# Patient Record
Sex: Female | Born: 1965 | Race: Black or African American | Hispanic: No | Marital: Single | Smoking: Current some day smoker
Health system: Southern US, Community
[De-identification: ages and names within clinical notes are randomized; demographics above are authoritative.]

## PROBLEM LIST (undated history)

## (undated) DIAGNOSIS — F419 Anxiety disorder, unspecified: Secondary | ICD-10-CM

## (undated) DIAGNOSIS — M5430 Sciatica, unspecified side: Secondary | ICD-10-CM

## (undated) DIAGNOSIS — I10 Essential (primary) hypertension: Secondary | ICD-10-CM

## (undated) DIAGNOSIS — K219 Gastro-esophageal reflux disease without esophagitis: Secondary | ICD-10-CM

---

## 1999-10-09 ENCOUNTER — Emergency Department (HOSPITAL_COMMUNITY): Admission: EM | Admit: 1999-10-09 | Discharge: 1999-10-10 | Payer: Self-pay | Admitting: Emergency Medicine

## 1999-10-09 ENCOUNTER — Encounter: Payer: Self-pay | Admitting: Emergency Medicine

## 1999-10-09 ENCOUNTER — Emergency Department (HOSPITAL_COMMUNITY): Admission: EM | Admit: 1999-10-09 | Discharge: 1999-10-09 | Payer: Self-pay | Admitting: Emergency Medicine

## 2000-11-25 ENCOUNTER — Encounter: Admission: RE | Admit: 2000-11-25 | Discharge: 2000-11-25 | Payer: Self-pay | Admitting: Family Medicine

## 2000-11-25 ENCOUNTER — Other Ambulatory Visit: Admission: RE | Admit: 2000-11-25 | Discharge: 2000-11-25 | Payer: Self-pay | Admitting: *Deleted

## 2001-12-31 ENCOUNTER — Emergency Department (HOSPITAL_COMMUNITY): Admission: EM | Admit: 2001-12-31 | Discharge: 2001-12-31 | Payer: Self-pay | Admitting: *Deleted

## 2001-12-31 ENCOUNTER — Encounter: Payer: Self-pay | Admitting: *Deleted

## 2002-01-03 ENCOUNTER — Encounter: Admission: RE | Admit: 2002-01-03 | Discharge: 2002-01-03 | Payer: Self-pay | Admitting: Family Medicine

## 2002-01-14 ENCOUNTER — Encounter: Admission: RE | Admit: 2002-01-14 | Discharge: 2002-01-14 | Payer: Self-pay | Admitting: Family Medicine

## 2002-02-08 ENCOUNTER — Encounter: Admission: RE | Admit: 2002-02-08 | Discharge: 2002-02-08 | Payer: Self-pay | Admitting: Sports Medicine

## 2002-02-08 ENCOUNTER — Other Ambulatory Visit: Admission: RE | Admit: 2002-02-08 | Discharge: 2002-02-08 | Payer: Self-pay | Admitting: Family Medicine

## 2003-07-10 ENCOUNTER — Encounter: Admission: RE | Admit: 2003-07-10 | Discharge: 2003-07-10 | Payer: Self-pay | Admitting: Family Medicine

## 2003-09-20 ENCOUNTER — Encounter: Admission: RE | Admit: 2003-09-20 | Discharge: 2003-09-20 | Payer: Self-pay | Admitting: Family Medicine

## 2003-09-20 ENCOUNTER — Other Ambulatory Visit: Admission: RE | Admit: 2003-09-20 | Discharge: 2003-09-20 | Payer: Self-pay | Admitting: Family Medicine

## 2003-09-29 ENCOUNTER — Encounter: Admission: RE | Admit: 2003-09-29 | Discharge: 2003-09-29 | Payer: Self-pay | Admitting: Family Medicine

## 2004-03-13 ENCOUNTER — Emergency Department (HOSPITAL_COMMUNITY): Admission: EM | Admit: 2004-03-13 | Discharge: 2004-03-13 | Payer: Self-pay | Admitting: Emergency Medicine

## 2004-06-03 ENCOUNTER — Ambulatory Visit: Payer: Self-pay | Admitting: Family Medicine

## 2004-07-12 ENCOUNTER — Emergency Department (HOSPITAL_COMMUNITY): Admission: EM | Admit: 2004-07-12 | Discharge: 2004-07-12 | Payer: Self-pay | Admitting: Emergency Medicine

## 2004-07-15 ENCOUNTER — Emergency Department (HOSPITAL_COMMUNITY): Admission: EM | Admit: 2004-07-15 | Discharge: 2004-07-15 | Payer: Self-pay | Admitting: Family Medicine

## 2004-07-16 ENCOUNTER — Emergency Department (HOSPITAL_COMMUNITY): Admission: EM | Admit: 2004-07-16 | Discharge: 2004-07-16 | Payer: Self-pay | Admitting: Emergency Medicine

## 2004-07-22 ENCOUNTER — Emergency Department (HOSPITAL_COMMUNITY): Admission: EM | Admit: 2004-07-22 | Discharge: 2004-07-22 | Payer: Self-pay | Admitting: Family Medicine

## 2004-08-02 ENCOUNTER — Emergency Department (HOSPITAL_COMMUNITY): Admission: EM | Admit: 2004-08-02 | Discharge: 2004-08-02 | Payer: Self-pay | Admitting: Emergency Medicine

## 2004-08-07 ENCOUNTER — Ambulatory Visit: Payer: Self-pay | Admitting: Family Medicine

## 2004-08-18 ENCOUNTER — Emergency Department (HOSPITAL_COMMUNITY): Admission: EM | Admit: 2004-08-18 | Discharge: 2004-08-18 | Payer: Self-pay | Admitting: Family Medicine

## 2004-08-22 ENCOUNTER — Ambulatory Visit: Payer: Self-pay | Admitting: Family Medicine

## 2004-08-24 ENCOUNTER — Encounter (INDEPENDENT_AMBULATORY_CARE_PROVIDER_SITE_OTHER): Payer: Self-pay | Admitting: *Deleted

## 2004-09-11 ENCOUNTER — Other Ambulatory Visit: Admission: RE | Admit: 2004-09-11 | Discharge: 2004-09-11 | Payer: Self-pay | Admitting: Family Medicine

## 2004-09-11 ENCOUNTER — Ambulatory Visit: Payer: Self-pay | Admitting: Sports Medicine

## 2004-09-27 ENCOUNTER — Ambulatory Visit: Payer: Self-pay | Admitting: Family Medicine

## 2004-10-14 ENCOUNTER — Ambulatory Visit: Payer: Self-pay | Admitting: Family Medicine

## 2004-11-28 ENCOUNTER — Ambulatory Visit: Payer: Self-pay | Admitting: Family Medicine

## 2005-01-01 ENCOUNTER — Ambulatory Visit: Payer: Self-pay | Admitting: Family Medicine

## 2005-01-13 ENCOUNTER — Ambulatory Visit: Payer: Self-pay | Admitting: Family Medicine

## 2005-01-19 ENCOUNTER — Emergency Department (HOSPITAL_COMMUNITY): Admission: EM | Admit: 2005-01-19 | Discharge: 2005-01-19 | Payer: Self-pay | Admitting: Emergency Medicine

## 2005-02-07 ENCOUNTER — Ambulatory Visit: Payer: Self-pay | Admitting: Family Medicine

## 2005-04-11 ENCOUNTER — Ambulatory Visit: Payer: Self-pay | Admitting: Family Medicine

## 2005-04-14 ENCOUNTER — Ambulatory Visit: Payer: Self-pay | Admitting: Family Medicine

## 2005-05-04 ENCOUNTER — Emergency Department (HOSPITAL_COMMUNITY): Admission: AD | Admit: 2005-05-04 | Discharge: 2005-05-04 | Payer: Self-pay | Admitting: Family Medicine

## 2005-05-27 ENCOUNTER — Ambulatory Visit: Payer: Self-pay | Admitting: Sports Medicine

## 2005-06-02 ENCOUNTER — Ambulatory Visit: Payer: Self-pay | Admitting: Sports Medicine

## 2005-07-10 ENCOUNTER — Ambulatory Visit: Payer: Self-pay | Admitting: Family Medicine

## 2005-08-12 ENCOUNTER — Ambulatory Visit: Payer: Self-pay | Admitting: Sports Medicine

## 2005-10-02 ENCOUNTER — Other Ambulatory Visit: Admission: RE | Admit: 2005-10-02 | Discharge: 2005-10-02 | Payer: Self-pay | Admitting: Family Medicine

## 2005-10-02 ENCOUNTER — Ambulatory Visit: Payer: Self-pay | Admitting: Family Medicine

## 2005-10-30 ENCOUNTER — Ambulatory Visit: Payer: Self-pay | Admitting: Family Medicine

## 2005-12-05 ENCOUNTER — Ambulatory Visit: Payer: Self-pay | Admitting: Family Medicine

## 2005-12-05 ENCOUNTER — Encounter: Admission: RE | Admit: 2005-12-05 | Discharge: 2005-12-05 | Payer: Self-pay | Admitting: Sports Medicine

## 2006-04-08 ENCOUNTER — Ambulatory Visit: Payer: Self-pay | Admitting: Sports Medicine

## 2006-06-29 ENCOUNTER — Ambulatory Visit: Payer: Self-pay | Admitting: Family Medicine

## 2006-07-03 ENCOUNTER — Ambulatory Visit: Payer: Self-pay | Admitting: Family Medicine

## 2006-07-08 ENCOUNTER — Encounter: Admission: RE | Admit: 2006-07-08 | Discharge: 2006-07-08 | Payer: Self-pay | Admitting: Sports Medicine

## 2006-07-24 ENCOUNTER — Encounter (INDEPENDENT_AMBULATORY_CARE_PROVIDER_SITE_OTHER): Payer: Self-pay | Admitting: *Deleted

## 2006-07-29 ENCOUNTER — Telehealth: Payer: Self-pay | Admitting: *Deleted

## 2006-07-29 ENCOUNTER — Ambulatory Visit: Payer: Self-pay | Admitting: Family Medicine

## 2006-07-29 LAB — CONVERTED CEMR LAB: Rapid Strep: NEGATIVE

## 2006-07-30 ENCOUNTER — Encounter (INDEPENDENT_AMBULATORY_CARE_PROVIDER_SITE_OTHER): Payer: Self-pay | Admitting: Family Medicine

## 2006-07-31 ENCOUNTER — Encounter (INDEPENDENT_AMBULATORY_CARE_PROVIDER_SITE_OTHER): Payer: Self-pay | Admitting: Family Medicine

## 2006-07-31 ENCOUNTER — Ambulatory Visit: Payer: Self-pay | Admitting: Sports Medicine

## 2006-07-31 DIAGNOSIS — E669 Obesity, unspecified: Secondary | ICD-10-CM | POA: Insufficient documentation

## 2006-07-31 DIAGNOSIS — I10 Essential (primary) hypertension: Secondary | ICD-10-CM | POA: Insufficient documentation

## 2006-07-31 LAB — CONVERTED CEMR LAB
BUN: 8 mg/dL (ref 6–23)
Chloride: 108 meq/L (ref 96–112)
Cholesterol: 181 mg/dL (ref 0–200)
HDL: 34 mg/dL — ABNORMAL LOW (ref >39)
LDL Cholesterol: 129 mg/dL — ABNORMAL HIGH (ref 0–99)
Sodium: 141 meq/L (ref 135–145)
Total CHOL/HDL Ratio: 5.3
Triglycerides: 89 mg/dL (ref <150)

## 2006-08-18 ENCOUNTER — Telehealth (INDEPENDENT_AMBULATORY_CARE_PROVIDER_SITE_OTHER): Payer: Self-pay | Admitting: Family Medicine

## 2006-08-25 ENCOUNTER — Encounter (INDEPENDENT_AMBULATORY_CARE_PROVIDER_SITE_OTHER): Payer: Self-pay | Admitting: Family Medicine

## 2006-09-03 ENCOUNTER — Telehealth (INDEPENDENT_AMBULATORY_CARE_PROVIDER_SITE_OTHER): Payer: Self-pay | Admitting: Family Medicine

## 2006-10-07 ENCOUNTER — Telehealth: Payer: Self-pay | Admitting: *Deleted

## 2006-10-28 ENCOUNTER — Telehealth: Payer: Self-pay | Admitting: *Deleted

## 2006-10-29 ENCOUNTER — Ambulatory Visit: Payer: Self-pay | Admitting: Family Medicine

## 2006-10-30 ENCOUNTER — Telehealth (INDEPENDENT_AMBULATORY_CARE_PROVIDER_SITE_OTHER): Payer: Self-pay | Admitting: Family Medicine

## 2006-11-12 ENCOUNTER — Encounter (INDEPENDENT_AMBULATORY_CARE_PROVIDER_SITE_OTHER): Payer: Self-pay | Admitting: Family Medicine

## 2006-11-12 ENCOUNTER — Ambulatory Visit: Payer: Self-pay | Admitting: Sports Medicine

## 2006-11-12 ENCOUNTER — Other Ambulatory Visit: Admission: RE | Admit: 2006-11-12 | Discharge: 2006-11-12 | Payer: Self-pay | Admitting: Family Medicine

## 2006-11-12 LAB — CONVERTED CEMR LAB
GC Probe Amp, Genital: NEGATIVE
KOH Prep: NEGATIVE

## 2006-11-17 ENCOUNTER — Encounter (INDEPENDENT_AMBULATORY_CARE_PROVIDER_SITE_OTHER): Payer: Self-pay | Admitting: Family Medicine

## 2006-12-15 ENCOUNTER — Telehealth (INDEPENDENT_AMBULATORY_CARE_PROVIDER_SITE_OTHER): Payer: Self-pay | Admitting: Family Medicine

## 2006-12-25 ENCOUNTER — Telehealth: Payer: Self-pay | Admitting: *Deleted

## 2007-05-11 ENCOUNTER — Telehealth (INDEPENDENT_AMBULATORY_CARE_PROVIDER_SITE_OTHER): Payer: Self-pay | Admitting: Family Medicine

## 2007-05-28 ENCOUNTER — Telehealth: Payer: Self-pay | Admitting: *Deleted

## 2007-06-30 ENCOUNTER — Telehealth (INDEPENDENT_AMBULATORY_CARE_PROVIDER_SITE_OTHER): Payer: Self-pay | Admitting: *Deleted

## 2007-07-14 ENCOUNTER — Telehealth: Payer: Self-pay | Admitting: *Deleted

## 2007-10-07 ENCOUNTER — Telehealth: Payer: Self-pay | Admitting: *Deleted

## 2007-10-29 ENCOUNTER — Ambulatory Visit: Payer: Self-pay | Admitting: Family Medicine

## 2007-10-29 ENCOUNTER — Encounter: Payer: Self-pay | Admitting: Family Medicine

## 2007-10-29 ENCOUNTER — Telehealth (INDEPENDENT_AMBULATORY_CARE_PROVIDER_SITE_OTHER): Payer: Self-pay | Admitting: *Deleted

## 2007-10-29 LAB — CONVERTED CEMR LAB
Protein, U semiquant: >=300
pH: 6

## 2007-10-30 ENCOUNTER — Encounter: Payer: Self-pay | Admitting: Family Medicine

## 2007-12-30 ENCOUNTER — Telehealth: Payer: Self-pay | Admitting: *Deleted

## 2008-02-16 ENCOUNTER — Telehealth (INDEPENDENT_AMBULATORY_CARE_PROVIDER_SITE_OTHER): Payer: Self-pay | Admitting: Family Medicine

## 2008-03-02 ENCOUNTER — Telehealth (INDEPENDENT_AMBULATORY_CARE_PROVIDER_SITE_OTHER): Payer: Self-pay | Admitting: *Deleted

## 2008-03-03 ENCOUNTER — Ambulatory Visit: Payer: Self-pay | Admitting: Family Medicine

## 2008-03-03 LAB — CONVERTED CEMR LAB: Whiff Test: POSITIVE

## 2008-04-27 ENCOUNTER — Telehealth: Payer: Self-pay | Admitting: *Deleted

## 2008-07-28 ENCOUNTER — Emergency Department (HOSPITAL_COMMUNITY): Admission: EM | Admit: 2008-07-28 | Discharge: 2008-07-28 | Payer: Self-pay | Admitting: Emergency Medicine

## 2008-08-03 ENCOUNTER — Encounter: Payer: Self-pay | Admitting: *Deleted

## 2008-08-04 ENCOUNTER — Ambulatory Visit: Payer: Self-pay | Admitting: Family Medicine

## 2008-08-04 ENCOUNTER — Encounter (INDEPENDENT_AMBULATORY_CARE_PROVIDER_SITE_OTHER): Payer: Self-pay | Admitting: Family Medicine

## 2008-08-04 LAB — CONVERTED CEMR LAB
Bilirubin Urine: NEGATIVE
Blood in Urine, dipstick: NEGATIVE
Ketones, urine, test strip: NEGATIVE
Protein, U semiquant: NEGATIVE
Urobilinogen, UA: 0.2
WBC Urine, dipstick: NEGATIVE
pH: 5.5

## 2008-11-09 ENCOUNTER — Telehealth: Payer: Self-pay | Admitting: Family Medicine

## 2008-11-11 ENCOUNTER — Emergency Department (HOSPITAL_COMMUNITY): Admission: EM | Admit: 2008-11-11 | Discharge: 2008-11-12 | Payer: Self-pay | Admitting: Emergency Medicine

## 2009-02-03 ENCOUNTER — Encounter (INDEPENDENT_AMBULATORY_CARE_PROVIDER_SITE_OTHER): Payer: Self-pay | Admitting: *Deleted

## 2009-02-03 DIAGNOSIS — F17200 Nicotine dependence, unspecified, uncomplicated: Secondary | ICD-10-CM | POA: Insufficient documentation

## 2010-03-29 ENCOUNTER — Encounter: Payer: Self-pay | Admitting: Family Medicine

## 2010-06-27 NOTE — Miscellaneous (Signed)
  Clinical Lists Changes  Problems: Removed problem of MUSCLE SPASM, LUMBAR REGION (ICD-724.8) Removed problem of FREQUENCY, URINARY (ICD-788.41) Removed problem of VAGINITIS, BACTERIAL (ICD-616.10) Removed problem of GYNECOLOGICAL EXAMINATION, ROUTINE (ICD-V72.31) Removed problem of EXPOSURE TO COMMUNICABLE DISEASE NOS (ICD-V01.9) Removed problem of VACCINE AGAINST TETANUS-DIPHTHERIA [TD][DT] (ICD-V06.5) Removed problem of HYPERCHOLESTEROLEMIA, HX OF (ICD-272.0)

## 2010-09-02 LAB — COMPREHENSIVE METABOLIC PANEL
ALT: 24 U/L (ref 0–35)
AST: 27 U/L (ref 0–37)
Albumin: 3.2 g/dL — ABNORMAL LOW (ref 3.5–5.2)
Alkaline Phosphatase: 52 U/L (ref 39–117)
BUN: 5 mg/dL — ABNORMAL LOW (ref 6–23)
Calcium: 8.3 mg/dL — ABNORMAL LOW (ref 8.4–10.5)
GFR calc Af Amer: >60 mL/min (ref >60)
Glucose, Bld: 95 mg/dL (ref 70–99)
Total Bilirubin: 0.4 mg/dL (ref 0.3–1.2)
Total Protein: 6.1 g/dL (ref 6.0–8.3)

## 2010-09-02 LAB — POCT CARDIAC MARKERS
CKMB, poc: <1 ng/mL — ABNORMAL LOW (ref 1.0–8.0)
CKMB, poc: <1 ng/mL — ABNORMAL LOW (ref 1.0–8.0)
Myoglobin, poc: 57.1 ng/mL (ref 12–200)
Myoglobin, poc: 57.7 ng/mL (ref 12–200)
Troponin i, poc: <0.05 ng/mL (ref 0.00–0.09)
Troponin i, poc: <0.05 ng/mL (ref 0.00–0.09)

## 2010-09-02 LAB — CBC
HCT: 36.7 % (ref 36.0–46.0)
MCHC: 32.7 g/dL (ref 30.0–36.0)
Platelets: 185 10*3/uL (ref 150–400)
RBC: 4.57 MIL/uL (ref 3.87–5.11)
WBC: 10.2 10*3/uL (ref 4.0–10.5)

## 2010-09-02 LAB — URINALYSIS, ROUTINE W REFLEX MICROSCOPIC
Bilirubin Urine: NEGATIVE
Glucose, UA: NEGATIVE mg/dL
Hgb urine dipstick: NEGATIVE
Urobilinogen, UA: 0.2 mg/dL (ref 0.0–1.0)

## 2010-09-02 LAB — DIFFERENTIAL
Eosinophils Relative: 2 % (ref 0–5)
Lymphs Abs: 3.6 10*3/uL (ref 0.7–4.0)
Neutro Abs: 5.6 10*3/uL (ref 1.7–7.7)
Neutrophils Relative %: 55 % (ref 43–77)

## 2010-09-02 LAB — LIPASE, BLOOD: Lipase: 27 U/L (ref 11–59)

## 2010-09-02 LAB — POCT PREGNANCY, URINE: Preg Test, Ur: NEGATIVE

## 2010-09-02 LAB — D-DIMER, QUANTITATIVE: D-Dimer, Quant: 0.42 ug/mL-FEU (ref 0.00–0.48)

## 2012-01-30 ENCOUNTER — Observation Stay (HOSPITAL_COMMUNITY)
Admission: EM | Admit: 2012-01-30 | Discharge: 2012-01-31 | Disposition: A | Discharge status: Home / Self Care | Payer: 59 | Attending: Internal Medicine | Admitting: Internal Medicine

## 2012-01-30 ENCOUNTER — Emergency Department (HOSPITAL_COMMUNITY): Payer: Self-pay

## 2012-01-30 ENCOUNTER — Encounter (HOSPITAL_COMMUNITY): Payer: Self-pay

## 2012-01-30 DIAGNOSIS — F411 Generalized anxiety disorder: Secondary | ICD-10-CM | POA: Insufficient documentation

## 2012-01-30 DIAGNOSIS — R0789 Other chest pain: Principal | ICD-10-CM | POA: Diagnosis present

## 2012-01-30 DIAGNOSIS — F419 Anxiety disorder, unspecified: Secondary | ICD-10-CM | POA: Diagnosis present

## 2012-01-30 DIAGNOSIS — K219 Gastro-esophageal reflux disease without esophagitis: Secondary | ICD-10-CM | POA: Insufficient documentation

## 2012-01-30 DIAGNOSIS — E669 Obesity, unspecified: Secondary | ICD-10-CM

## 2012-01-30 DIAGNOSIS — R079 Chest pain, unspecified: Secondary | ICD-10-CM

## 2012-01-30 DIAGNOSIS — I10 Essential (primary) hypertension: Secondary | ICD-10-CM | POA: Diagnosis present

## 2012-01-30 DIAGNOSIS — F17200 Nicotine dependence, unspecified, uncomplicated: Secondary | ICD-10-CM | POA: Diagnosis present

## 2012-01-30 HISTORY — DX: Gastro-esophageal reflux disease without esophagitis: K21.9

## 2012-01-30 HISTORY — DX: Essential (primary) hypertension: I10

## 2012-01-30 HISTORY — DX: Anxiety disorder, unspecified: F41.9

## 2012-01-30 LAB — BASIC METABOLIC PANEL
BUN: 5 mg/dL — ABNORMAL LOW (ref 6–23)
CO2: 28 mEq/L (ref 19–32)
Calcium: 8.7 mg/dL (ref 8.4–10.5)
Creatinine, Ser: 0.82 mg/dL (ref 0.50–1.10)
Glucose, Bld: 90 mg/dL (ref 70–99)

## 2012-01-30 LAB — URINALYSIS, MICROSCOPIC ONLY
Bilirubin Urine: NEGATIVE
Glucose, UA: NEGATIVE mg/dL
Specific Gravity, Urine: 1.018 (ref 1.005–1.030)
pH: 5.5 (ref 5.0–8.0)

## 2012-01-30 LAB — CBC
HCT: 41.5 % (ref 36.0–46.0)
Hemoglobin: 13.6 g/dL (ref 12.0–15.0)
MCH: 25.4 pg — ABNORMAL LOW (ref 26.0–34.0)
MCV: 77.4 fL — ABNORMAL LOW (ref 78.0–100.0)
Platelets: 181 10*3/uL (ref 150–400)
RBC: 5.36 MIL/uL — ABNORMAL HIGH (ref 3.87–5.11)

## 2012-01-30 LAB — POCT I-STAT TROPONIN I: Troponin i, poc: 0.01 ng/mL (ref 0.00–0.08)

## 2012-01-30 MED ORDER — NITROGLYCERIN 0.4 MG SL SUBL
0.4000 mg | SUBLINGUAL_TABLET | SUBLINGUAL | Status: DC | PRN
  Administered 2012-01-30: 0.4 mg via SUBLINGUAL
  Filled 2012-01-30: qty 25

## 2012-01-30 MED ORDER — MORPHINE SULFATE 4 MG/ML IJ SOLN
4.0000 mg | Freq: Once | INTRAMUSCULAR | Status: DC
  Filled 2012-01-30: qty 1

## 2012-01-30 MED ORDER — POTASSIUM CHLORIDE CRYS ER 20 MEQ PO TBCR
40.0000 meq | EXTENDED_RELEASE_TABLET | Freq: Once | ORAL | Status: AC
  Administered 2012-01-30: 40 meq via ORAL
  Filled 2012-01-30: qty 2

## 2012-01-30 MED ORDER — KETOROLAC TROMETHAMINE 30 MG/ML IJ SOLN
30.0000 mg | Freq: Once | INTRAMUSCULAR | Status: AC
  Administered 2012-01-30: 30 mg via INTRAVENOUS
  Filled 2012-01-30: qty 1

## 2012-01-30 MED ORDER — ASPIRIN 325 MG PO TABS
325.0000 mg | ORAL_TABLET | ORAL | Status: AC
  Administered 2012-01-30: 325 mg via ORAL
  Filled 2012-01-30: qty 1

## 2012-01-30 NOTE — ED Notes (Signed)
PA at bedside.

## 2012-01-30 NOTE — ED Notes (Signed)
Patient transported to X-ray, was in CT earlier.

## 2012-01-30 NOTE — ED Provider Notes (Signed)
History     CSN: [id number]  Arrival date & time 01/30/12  1529   First MD Initiated Contact with Patient 01/30/12 2002      Chief Complaint  Patient presents with  . Chest Pain  . Shortness of Breath  . Foot Pain    (Consider location/radiation/quality/duration/timing/severity/associated sxs/prior treatment) HPI History provided by pt.   Pt was diagnosed w/ anxiety attacks in 2007.  They presented with SOB and sweaty palms.  For the past month, since her step-father had a heart attack, she has had increased frequency of panic attacks and they are now presenting w/ CP and paresthesias left face and LUE.  Describes CP as mid-line squeezing and shooting pains that go all the way across.  Occasionally has nausea with associated jaw tightness that does not necessarily occur with the CP/SOB.  These symptoms are non-exertional and can last anywhere from seconds to an hour, usually improving with distraction.  She does however have more SOB and fatigue with climbing the stairs than she used to have.  Pt has HTN and smokes cigarettes.  No FH of early MI.  Denies fever, cough, abd pain and LE pain/edema.  Does not take medication for anxiety.  She also c/o intermittent squiggly lines diffuse, bilateral visual fields with occasional blurred vision.  Denies dizziness and ataxia or any other neuro deficits.     Past Medical History  Diagnosis Date  . Anxiety   . Hypertension   . GERD (gastroesophageal reflux disease)     Past Surgical History  Procedure Date  . Cesarean section     History reviewed. No pertinent family history.  History  Substance Use Topics  . Smoking status: Current Some Day Smoker -- 0.5 packs/day    Types: Cigarettes  . Smokeless tobacco: Never Used  . Alcohol Use: Yes     occasionally    OB History    Grav Para Term Preterm Abortions TAB SAB Ect Mult Living                  Review of Systems  All other systems reviewed and are negative.    Allergies    Review of patient's allergies indicates no known allergies.  Home Medications  No current outpatient prescriptions on file.  BP 138/74  Pulse 50  Temp 98.4 F (36.9 C) (Oral)  Resp 16  SpO2 100%  LMP 01/30/2012  Physical Exam  Nursing note and vitals reviewed. Constitutional: She is oriented to person, place, and time. She appears well-developed and well-nourished. No distress.  HENT:  Head: Normocephalic and atraumatic.  Eyes:       Normal appearance  Neck: Normal range of motion.  Cardiovascular: Normal rate, regular rhythm and intact distal pulses.   Pulmonary/Chest: Effort normal and breath sounds normal.  Abdominal: Soft. Bowel sounds are normal. She exhibits no distension. There is no tenderness.  Musculoskeletal: Normal range of motion.       No LE edema/ttp  Neurological: She is alert and oriented to person, place, and time. She displays no tremor. No sensory deficit. She displays a negative Romberg sign. Coordination and gait normal.       CN 3-12 intact.  5/5 and equal upper and lower extremity strength.  No past pointing.  No pronator drift.  Rotational nystagmus bilaterally when she goes from right lateral gaze to looking straight forward only.    Skin: Skin is warm and dry. No rash noted.       Vitiligo  Psychiatric: She has a normal mood and affect. Her behavior is normal.    ED Course  Procedures (including critical care time)  Labs Reviewed  CBC - Abnormal; Notable for the following:    RBC 5.36 (*)     MCV 77.4 (*)     MCH 25.4 (*)     All other components within normal limits  BASIC METABOLIC PANEL - Abnormal; Notable for the following:    Potassium 3.1 (*)     BUN 5 (*)     GFR calc non Af Amer 85 (*)     All other components within normal limits  URINALYSIS, WITH MICROSCOPIC - Abnormal; Notable for the following:    APPearance CLOUDY (*)     Hgb urine dipstick SMALL (*)     Bacteria, UA FEW (*)     Squamous Epithelial / LPF FEW (*)     All  other components within normal limits  POCT I-STAT TROPONIN I   Dg Chest 2 View  01/30/2012  *RADIOLOGY REPORT*  Clinical Data: Left-sided chest pain, shortness of breath, and anxiety.  CHEST - 2 VIEW  Comparison: 11/11/2008  Findings: The heart size and pulmonary vascularity are normal. The lungs appear clear and expanded without focal air space disease or consolidation. No blunting of the costophrenic angles.  No pneumothorax.  Mediastinal contours appear intact.  No significant change since previous study.  Degenerative changes in the spine.  IMPRESSION: No evidence of active pulmonary disease.   Original Report Authenticated By: Marlon Pel, M.D.    Ct Head Wo Contrast  01/30/2012  *RADIOLOGY REPORT*  Clinical Data: Dizziness  CT HEAD WITHOUT CONTRAST  Technique:  Contiguous axial images were obtained from the base of the skull through the vertex without contrast.  Comparison: None.  Findings: Normal size of the ventricles.  Normal gray-white differentiation.  No evidence of hemorrhage, hydrocephalus, mass lesion, mass effect, or evidence of acute cortically based infarction.  Soft tissues of the orbits and scalp are symmetric.  The visualized paranasal sinuses, mastoid air cells, and middle ears are clear.  IMPRESSION: Normal normal head CT.   Original Report Authenticated By: Britta Mccreedy, M.D.      1. Chest pain   2. Anxiety   3. Obesity, unspecified   4. Tobacco use disorder       MDM  46yo F w/ h/o anxiety, HTN and tobacco abuse presents w/ atypical panic attacks w/ CP, SOB, jaw tightness, LUE paresthesias and nausea.  Also c/o 2 weeks intermittent squiggly lines in vision.  On exam, afebrile, well-appearing, no focal neuro findings w/ exception of bilateral rotational nystagmus when goes from right lateral gaze to looking straight ahead.  CT head, EKG and labs pending.  Pt has received aspirin.  SL nitro ordered for pain.  Will reassess shortly.  8:33 PM    Pt has received one  dose of SL ntg and reports that pain is mildly improved.  VSS.  EKG non-ischemic.  Labs sig for mild hypokalemia and neg troponin.  CXR and CT head neg.  [facility] consulted for admission for r/o MI.  10:13 PM         Otilio Miu, [place] 01/31/12 [phone]

## 2012-01-30 NOTE — ED Notes (Signed)
C/o pain to mid chest/LT chest on/off x few days and across upper back.  Also has been feeling shob.  States she feels like this w/a panic attack.  States she has been having severe episodes since her stepdad had a massive heartattack in august.  Also c/o seeing "squiggly lines" in her vision and a "tingling feeling" in LT jaw that comes and goes.

## 2012-01-30 NOTE — ED Notes (Signed)
Patient transported to X-ray 

## 2012-01-30 NOTE — ED Provider Notes (Signed)
MSE initiated.  Patient reports having episodes of chest pain over the last several days with increasing shortness of breath.  Patient has a history of anxiety, but this presentation with chest pain is different from prior episodes.  Lungs CTA bilaterally.  S1/S2, RRR, no murmur.  O2 sats of 97% on room air.  Monitor reveals NSR without ectopy.  12 lead reviewed, no current indication of ischemia.  CP orders initiated.  Jimmye Norman, NP 01/30/12 1719

## 2012-01-30 NOTE — ED Notes (Addendum)
MD (Dr. Conley Rolls) at bedside, verbally discontinued IV Morphine and ordered Toradol.

## 2012-01-30 NOTE — ED Notes (Signed)
Patient returned from X-ray 

## 2012-01-31 DIAGNOSIS — R0789 Other chest pain: Secondary | ICD-10-CM

## 2012-01-31 DIAGNOSIS — I10 Essential (primary) hypertension: Secondary | ICD-10-CM

## 2012-01-31 DIAGNOSIS — R072 Precordial pain: Secondary | ICD-10-CM

## 2012-01-31 LAB — BASIC METABOLIC PANEL
BUN: 6 mg/dL (ref 6–23)
CO2: 27 mEq/L (ref 19–32)
Calcium: 8.6 mg/dL (ref 8.4–10.5)
Chloride: 104 mEq/L (ref 96–112)
Creatinine, Ser: 0.85 mg/dL (ref 0.50–1.10)
Glucose, Bld: 95 mg/dL (ref 70–99)

## 2012-01-31 LAB — TROPONIN I: Troponin I: <0.3 ng/mL (ref <0.30)

## 2012-01-31 LAB — CBC
HCT: 38 % (ref 36.0–46.0)
MCH: 25.8 pg — ABNORMAL LOW (ref 26.0–34.0)
MCHC: 33.4 g/dL (ref 30.0–36.0)
MCV: 77.1 fL — ABNORMAL LOW (ref 78.0–100.0)
Platelets: 153 10*3/uL (ref 150–400)
RDW: 14.7 % (ref 11.5–15.5)

## 2012-01-31 MED ORDER — SODIUM CHLORIDE 0.9 % IJ SOLN
3.0000 mL | Freq: Two times a day (BID) | INTRAMUSCULAR | Status: DC
  Administered 2012-01-31: 3 mL via INTRAVENOUS

## 2012-01-31 MED ORDER — KETOROLAC TROMETHAMINE 30 MG/ML IJ SOLN
30.0000 mg | Freq: Four times a day (QID) | INTRAMUSCULAR | Status: DC | PRN
  Administered 2012-01-31: 30 mg via INTRAVENOUS
  Filled 2012-01-31: qty 1

## 2012-01-31 MED ORDER — POTASSIUM CHLORIDE CRYS ER 20 MEQ PO TBCR
40.0000 meq | EXTENDED_RELEASE_TABLET | Freq: Every day | ORAL | Status: DC
  Administered 2012-01-31: 40 meq via ORAL
  Filled 2012-01-31: qty 2

## 2012-01-31 MED ORDER — ASPIRIN EC 325 MG PO TBEC
325.0000 mg | DELAYED_RELEASE_TABLET | Freq: Every day | ORAL | Status: DC
  Administered 2012-01-31: 325 mg via ORAL
  Filled 2012-01-31: qty 1

## 2012-01-31 MED ORDER — LORAZEPAM 1 MG PO TABS: 1.0000 mg | ORAL_TABLET | Freq: Three times a day (TID) | ORAL | Status: DC | PRN

## 2012-01-31 MED ORDER — SODIUM CHLORIDE 0.9 % IJ SOLN
3.0000 mL | Freq: Two times a day (BID) | INTRAMUSCULAR | Status: DC
  Administered 2012-01-31 (×2): 3 mL via INTRAVENOUS

## 2012-01-31 MED ORDER — SODIUM CHLORIDE 0.9 % IJ SOLN: 3.0000 mL | INTRAMUSCULAR | Status: DC | PRN

## 2012-01-31 MED ORDER — PANTOPRAZOLE SODIUM 40 MG PO TBEC
40.0000 mg | DELAYED_RELEASE_TABLET | Freq: Every day | ORAL | Status: DC
  Filled 2012-01-31: qty 1

## 2012-01-31 MED ORDER — NAPROXEN 500 MG PO TABS: 500.0000 mg | ORAL_TABLET | Freq: Two times a day (BID) | ORAL | Status: AC

## 2012-01-31 MED ORDER — MORPHINE SULFATE 2 MG/ML IJ SOLN: 2.0000 mg | INTRAMUSCULAR | Status: DC | PRN

## 2012-01-31 MED ORDER — SODIUM CHLORIDE 0.9 % IV SOLN: 250.0000 mL | INTRAVENOUS | Status: DC | PRN

## 2012-01-31 NOTE — Progress Notes (Signed)
  Echocardiogram 2D Echocardiogram has been performed.  Kaitlyn Robertson 01/31/2012, 10:44 AM

## 2012-01-31 NOTE — ED Provider Notes (Signed)
Medical screening examination/treatment/procedure(s) were conducted as a shared visit with non-physician practitioner(s) and myself.  I personally evaluated the patient during the encounter  Intermittent squeezing chest pain x 1 month with SOB and jaw paresthesias.  Nonexertional but does have fatigue with climbing stairs.  Hx HTN, tobaccos abuse.  Reproducible chest tenderness on exam but patient states that is not the pain she has been having.   Glynn Octave, MD 01/31/12 1106

## 2012-01-31 NOTE — H&P (Signed)
[facility] History and Physical  Kaitlyn Robertson UXL:[id number] DOB: [birth year]    PCP:   Shelly Flatten, MD   Chief Complaint: chest pain on and off for a month.  HPI: Kaitlyn Robertson is an 46 y.o. female with hx of anxiety, morbid obesity, GERD, HTN, presents to the ER with intermittent chest pain for about a month.  It didn't correlate with exertion, but she was having shortness of breath, and her pain radiated to her jaws.  She was evaluated in the ER to include an EKG without any ischemic changes, in SR.  Her CXR was clear.  She had some floater in her visual field, and a head CT was ordered by EDP, and it was negative. She was given NTG and her discomfort improved.  Hospitalist was asked to admit her for r/out.  Rewiew of Systems:  Constitutional: Negative for malaise, fever and chills. No significant weight loss or weight gain Eyes: Negative for eye pain, redness and discharge, diplopia, visual changes, or flashes of light. ENMT: Negative for ear pain, hoarseness, nasal congestion, sinus pressure and sore throat. No headaches; tinnitus, drooling, or problem swallowing. Cardiovascular: Negative for palpitations, diaphoresis,  and peripheral edema. ; No orthopnea, PND Respiratory: Negative for cough, hemoptysis, wheezing and stridor. No pleuritic chestpain. Gastrointestinal: Negative for nausea, vomiting, diarrhea, constipation, abdominal pain, melena, blood in stool, hematemesis, jaundice and rectal bleeding.    Genitourinary: Negative for frequency, dysuria, incontinence,flank pain and hematuria; Musculoskeletal: Negative for back pain and neck pain. Negative for swelling and trauma.;  Skin: . Negative for pruritus, rash, abrasions, bruising and skin lesion.; ulcerations Neuro: Negative for headache, lightheadedness and neck stiffness. Negative for weakness, altered level of consciousness , altered mental status, extremity weakness, burning feet, involuntary movement, seizure and  syncope.  Psych: negative for, depression, insomnia, tearfulness, panic attacks, hallucinations, paranoia, suicidal or homicidal ideation   Past Medical History  Diagnosis Date  . Anxiety   . Hypertension   . GERD (gastroesophageal reflux disease)     Past Surgical History  Procedure Date  . Cesarean section     Medications:  HOME MEDS: Prior to Admission medications   Not on File     Allergies:  No Known Allergies  Social History:   reports that she has been smoking Cigarettes.  She has been smoking about .5 packs per day. She has never used smokeless tobacco. She reports that she drinks alcohol. She reports that she does not use illicit drugs.  Family History: History reviewed. No pertinent family history.   Physical Exam: Filed Vitals:   01/30/12 1622 01/30/12 1918 01/30/12 2225 01/31/12 0051  BP: 172/112 138/74 133/82 162/107  Pulse: 74 50 50 55  Temp: 98.4 F (36.9 C)   98.2 F (36.8 C)  TempSrc: Oral   Oral  Resp: 16 16 20 18   Height:    5\' 3"  (1.6 m)  Weight:    115.3 kg (254 lb 3.1 oz)  SpO2: 100% 100% 100% 96%   Blood pressure 162/107, pulse 55, temperature 98.2 F (36.8 C), temperature source Oral, resp. rate 18, height 5\' 3"  (1.6 m), weight 115.3 kg (254 lb 3.1 oz), last menstrual period 01/30/2012, SpO2 96.00%.  GEN:  Pleasant  patient lying in the stretcher in no acute distress; cooperative with exam. PSYCH:  alert and oriented x4; does not appear anxious or depressed; affect is appropriate. HEENT: Mucous membranes pink and anicteric; PERRLA; EOM intact; no cervical lymphadenopathy nor thyromegaly or carotid bruit; no JVD;  There were no stridor. Neck is very supple. Breasts:: Not examined CHEST WALL: there are tenderness to palpation at the sternal-costal junctions bilaterally. CHEST: Normal respiration, clear to auscultation bilaterally.  HEART: Regular rate and rhythm.  There are no murmur, rub, or gallops.   BACK: No kyphosis or scoliosis; no  CVA tenderness ABDOMEN: soft and non-tender; no masses, no organomegaly, normal abdominal bowel sounds; no pannus; no intertriginous candida. There is no rebound and no distention. Rectal Exam: Not done EXTREMITIES: No bone or joint deformity; age-appropriate arthropathy of the hands and knees; no edema; no ulcerations.  There is no calf tenderness. Genitalia: not examined PULSES: 2+ and symmetric SKIN: Normal hydration no rash or ulceration CNS: Cranial nerves 2-12 grossly intact no focal lateralizing neurologic deficit.  Speech is fluent; uvula elevated with phonation, facial symmetry and tongue midline. DTR are normal bilaterally, cerebella exam is intact, barbinski is negative and strengths are equaled bilaterally.  No sensory loss.   Labs on Admission:  Basic Metabolic Panel:  Lab 01/31/12 4696 01/30/12 1747  NA 140 141  K 3.1* 3.1*  CL 104 104  CO2 27 28  GLUCOSE 95 90  BUN 6 5*  CREATININE 0.85 0.82  CALCIUM 8.6 8.7  MG -- --  PHOS -- --   Liver Function Tests: No results found for this basename: AST:5,ALT:5,ALKPHOS:5,BILITOT:5,PROT:5,ALBUMIN:5 in the last 168 hours No results found for this basename: LIPASE:5,AMYLASE:5 in the last 168 hours No results found for this basename: AMMONIA:5 in the last 168 hours CBC:  Lab 01/31/12 0134 01/30/12 1747  WBC 8.0 8.5  NEUTROABS -- --  HGB 12.7 13.6  HCT 38.0 41.5  MCV 77.1* 77.4*  PLT 153 181   Cardiac Enzymes:  Lab 01/31/12 0134  CKTOTAL --  CKMB --  CKMBINDEX --  TROPONINI <0.30    CBG: No results found for this basename: GLUCAP:5 in the last 168 hours   Radiological Exams on Admission: Dg Chest 2 View  01/30/2012  *RADIOLOGY REPORT*  Clinical Data: Left-sided chest pain, shortness of breath, and anxiety.  CHEST - 2 VIEW  Comparison: 11/11/2008  Findings: The heart size and pulmonary vascularity are normal. The lungs appear clear and expanded without focal air space disease or consolidation. No blunting of the  costophrenic angles.  No pneumothorax.  Mediastinal contours appear intact.  No significant change since previous study.  Degenerative changes in the spine.  IMPRESSION: No evidence of active pulmonary disease.   Original Report Authenticated By: Marlon Pel, M.D.    Ct Head Wo Contrast  01/30/2012  *RADIOLOGY REPORT*  Clinical Data: Dizziness  CT HEAD WITHOUT CONTRAST  Technique:  Contiguous axial images were obtained from the base of the skull through the vertex without contrast.  Comparison: None.  Findings: Normal size of the ventricles.  Normal gray-white differentiation.  No evidence of hemorrhage, hydrocephalus, mass lesion, mass effect, or evidence of acute cortically based infarction.  Soft tissues of the orbits and scalp are symmetric.  The visualized paranasal sinuses, mastoid air cells, and middle ears are clear.  IMPRESSION: Normal normal head CT.   Original Report Authenticated By: Britta Mccreedy, M.D.     EKG: Independently reviewed. SR without any ischemic changes.   Assessment/Plan Present on Admission:  .Atypical chest pain .OBESITY NOS .HYPERTENSION, ESSENTIAL NOS .TOBACCO USER .Anxiety   PLAN:  I doubt very much that this is cardiac pain.  She does have many risks for CAD, but her chest pain is clearly palpable at the sternal  costal junctions bilaterally.  The relief of discomfort with NTG could also be from esosphageal spasm, not necessary because of angina.  Will admit her for R/out.  I have started her on PPI, along with toradol.  Will obtain an ECHO, which I suspect will be normal.  She was advised strongly to stop her cigarettes.  I also told her that she can take an SSRI and it would help her with anxiety.   She is stable, full code, and will be admitted to [facility] service.  Other plans as per orders.  Code Status: FULL Unk Lightning, MD. [facility] Pager [phone] 7pm to 7am.  01/31/2012, 5:15 AM

## 2012-01-31 NOTE — ED Provider Notes (Signed)
Medical screening examination/treatment/procedure(s) were performed by non-physician practitioner and as supervising physician I was immediately available for consultation/collaboration.  Apoorva Bugay, MD 01/31/12 0046 

## 2012-01-31 NOTE — Discharge Summary (Signed)
Physician Discharge Summary  [name] QMV:[id number] DOB: 1965-12-02 DOA: 01/30/2012  PCP: Shelly Flatten, MD  Admit date: 01/30/2012 Discharge date: 01/31/2012  Recommendations for Outpatient Follow-up:  1. Please follow up with your primary care physician in 1-2 weeks.   2. Also provided patient with naproxen for suspected costochondritis.  Please assess for improvement on this regimen 3. Check potassium level  Discharge Diagnoses:  Principal Problem:  *Atypical chest pain Active Problems:  OBESITY NOS  TOBACCO USER  HYPERTENSION, ESSENTIAL NOS  Anxiety   Discharge Condition: stable  Diet recommendation: Cardiac diet.  Filed Weights   01/31/12 0051 01/31/12 0505  Weight: 115.3 kg (254 lb 3.1 oz) 115.1 kg (253 lb 12 oz)    History of present illness:  From original HPI:  Kaitlyn Robertson is an 46 y.o. female with hx of anxiety, morbid obesity, GERD, HTN, presents to the ER with intermittent chest pain for about a month. It didn't correlate with exertion, but she was having shortness of breath, and her pain radiated to her jaws. She was evaluated in the ER to include an EKG without any ischemic changes, in SR. Her CXR was clear. She had some floater in her visual field, and a head CT was ordered by EDP, and it was negative.  She was given NTG and her discomfort improved. Hospitalist was asked to admit her for r/out.  Hospital Course:  Chest pain: While in house patient had > 3 sets of cardiac enzymes which were negative.  Echocardiogram did not show any regional wall motion abnormalities but she did have grade I diastolic dysfunction with normal EF of 55-60 percent.  At time of discharge patient was completely asymptomatic.   - suspect patient had costochondritis given history and as such will prescribe naproxen  Hypokalemia Was replaced orally during admission. Patient was given k dur 40 meq x 2.  Her potassium level was 3.1 initially.  Nicotine dependence Recommended  tobacco cessation.  Elevated blood pressures - May have been elevated 2ary to discomfort which has resolved.  Will provide naproxen which should help with the pain.  Patient's last blood pressure was 141/77 which was essentially at goal of 140/90 in a non diabetic.  Will recommend that patient follow up with a primary care physician for further evaluation.  Procedures:  Echocardiogram  Cardiac enzymes  EKG  Consultations:  none  Discharge Exam: Filed Vitals:   01/31/12 0051 01/31/12 0505 01/31/12 0651 01/31/12 1331  BP: 162/107  142/86 141/77  Pulse: 55  58 56  Temp: 98.2 F (36.8 C)  97.8 F (36.6 C) 97.8 F (36.6 C)  TempSrc: Oral  Oral Oral  Resp: 18  18 16   Height: 5\' 3"  (1.6 m)     Weight: 115.3 kg (254 lb 3.1 oz) 115.1 kg (253 lb 12 oz)    SpO2: 96%  98% 95%    General: Pt in NAD, A and O x 3 Cardiovascular: RRR, No MRG Respiratory: CTA BL, No wheezes  Discharge Instructions  Discharge Orders    Future Orders Please Complete By Expires   Diet - low sodium heart healthy      Increase activity slowly      Discharge instructions      Comments:   Please follow up with your primary care physician in 1-2 weeks.  We will have care manager provide a list of pcp's for you to choose from once discharged from the hospital.   Call MD for:  temperature >100.4  Call MD for:  severe uncontrolled pain      Call MD for:  persistant dizziness or light-headedness      Call MD for:  extreme fatigue        Medication List  As of 01/31/2012  4:14 PM   TAKE these medications         naproxen 500 MG tablet   Commonly known as: NAPROSYN   Take 1 tablet (500 mg total) by mouth 2 (two) times daily with a meal.              The results of significant diagnostics from this hospitalization (including imaging, microbiology, ancillary and laboratory) are listed below for reference.    Significant Diagnostic Studies: Dg Chest 2 View  01/30/2012  *RADIOLOGY REPORT*   Clinical Data: Left-sided chest pain, shortness of breath, and anxiety.  CHEST - 2 VIEW  Comparison: 11/11/2008  Findings: The heart size and pulmonary vascularity are normal. The lungs appear clear and expanded without focal air space disease or consolidation. No blunting of the costophrenic angles.  No pneumothorax.  Mediastinal contours appear intact.  No significant change since previous study.  Degenerative changes in the spine.  IMPRESSION: No evidence of active pulmonary disease.   Original Report Authenticated By: Marlon Pel, M.D.    Ct Head Wo Contrast  01/30/2012  *RADIOLOGY REPORT*  Clinical Data: Dizziness  CT HEAD WITHOUT CONTRAST  Technique:  Contiguous axial images were obtained from the base of the skull through the vertex without contrast.  Comparison: None.  Findings: Normal size of the ventricles.  Normal gray-white differentiation.  No evidence of hemorrhage, hydrocephalus, mass lesion, mass effect, or evidence of acute cortically based infarction.  Soft tissues of the orbits and scalp are symmetric.  The visualized paranasal sinuses, mastoid air cells, and middle ears are clear.  IMPRESSION: Normal normal head CT.   Original Report Authenticated By: Britta Mccreedy, M.D.     Microbiology: No results found for this or any previous visit (from the past 240 hour(s)).   Labs: Basic Metabolic Panel:  Lab 01/31/12 4782 01/30/12 1747  NA 140 141  K 3.1* 3.1*  CL 104 104  CO2 27 28  GLUCOSE 95 90  BUN 6 5*  CREATININE 0.85 0.82  CALCIUM 8.6 8.7  MG -- --  PHOS -- --   Liver Function Tests: No results found for this basename: AST:5,ALT:5,ALKPHOS:5,BILITOT:5,PROT:5,ALBUMIN:5 in the last 168 hours No results found for this basename: LIPASE:5,AMYLASE:5 in the last 168 hours No results found for this basename: AMMONIA:5 in the last 168 hours CBC:  Lab 01/31/12 0134 01/30/12 1747  WBC 8.0 8.5  NEUTROABS -- --  HGB 12.7 13.6  HCT 38.0 41.5  MCV 77.1* 77.4*  PLT 153 181     Cardiac Enzymes:  Lab 01/31/12 1246 01/31/12 0655 01/31/12 0134  CKTOTAL -- -- --  CKMB -- -- --  CKMBINDEX -- -- --  TROPONINI <0.30 <0.30 <0.30   BNP: BNP (last 3 results) No results found for this basename: PROBNP:3 in the last 8760 hours CBG: No results found for this basename: GLUCAP:5 in the last 168 hours  Time coordinating discharge: > 30 minutes  Signed:  Penny Pia  [facility] 01/31/2012, 4:14 PM

## 2013-08-01 ENCOUNTER — Emergency Department (HOSPITAL_COMMUNITY)
Admission: EM | Admit: 2013-08-01 | Discharge: 2013-08-01 | Disposition: A | Discharge status: Home / Self Care | Payer: Self-pay | Attending: Emergency Medicine | Admitting: Emergency Medicine

## 2013-08-01 ENCOUNTER — Encounter (HOSPITAL_COMMUNITY): Payer: Self-pay | Admitting: Emergency Medicine

## 2013-08-01 DIAGNOSIS — M549 Dorsalgia, unspecified: Secondary | ICD-10-CM

## 2013-08-01 DIAGNOSIS — F411 Generalized anxiety disorder: Secondary | ICD-10-CM | POA: Insufficient documentation

## 2013-08-01 DIAGNOSIS — IMO0002 Reserved for concepts with insufficient information to code with codable children: Secondary | ICD-10-CM | POA: Insufficient documentation

## 2013-08-01 DIAGNOSIS — M545 Low back pain, unspecified: Secondary | ICD-10-CM | POA: Insufficient documentation

## 2013-08-01 DIAGNOSIS — F17200 Nicotine dependence, unspecified, uncomplicated: Secondary | ICD-10-CM | POA: Insufficient documentation

## 2013-08-01 DIAGNOSIS — Z3202 Encounter for pregnancy test, result negative: Secondary | ICD-10-CM | POA: Insufficient documentation

## 2013-08-01 DIAGNOSIS — Z79899 Other long term (current) drug therapy: Secondary | ICD-10-CM | POA: Insufficient documentation

## 2013-08-01 DIAGNOSIS — K219 Gastro-esophageal reflux disease without esophagitis: Secondary | ICD-10-CM | POA: Insufficient documentation

## 2013-08-01 DIAGNOSIS — I10 Essential (primary) hypertension: Secondary | ICD-10-CM | POA: Insufficient documentation

## 2013-08-01 LAB — URINALYSIS, ROUTINE W REFLEX MICROSCOPIC
Bilirubin Urine: NEGATIVE
Glucose, UA: NEGATIVE mg/dL
Hgb urine dipstick: NEGATIVE
KETONES UR: NEGATIVE mg/dL
LEUKOCYTES UA: NEGATIVE
NITRITE: NEGATIVE
PH: 6 (ref 5.0–8.0)
PROTEIN: NEGATIVE mg/dL
Specific Gravity, Urine: 1.014 (ref 1.005–1.030)
Urobilinogen, UA: 0.2 mg/dL (ref 0.0–1.0)

## 2013-08-01 LAB — POC URINE PREG, ED: PREG TEST UR: NEGATIVE

## 2013-08-01 MED ORDER — HYDROCODONE-ACETAMINOPHEN 5-325 MG PO TABS
2.0000 | ORAL_TABLET | Freq: Once | ORAL | Status: AC
  Administered 2013-08-01: 2 via ORAL
  Filled 2013-08-01: qty 2

## 2013-08-01 MED ORDER — PREDNISONE 20 MG PO TABS
40.0000 mg | ORAL_TABLET | Freq: Every day | ORAL | Status: DC
Start: 2013-08-01 — End: 2014-03-07

## 2013-08-01 MED ORDER — ONDANSETRON 8 MG PO TBDP
8.0000 mg | ORAL_TABLET | Freq: Once | ORAL | Status: AC
  Administered 2013-08-01: 8 mg via ORAL
  Filled 2013-08-01: qty 1

## 2013-08-01 MED ORDER — HYDROCODONE-ACETAMINOPHEN 5-325 MG PO TABS: 1.0000 | ORAL_TABLET | Freq: Four times a day (QID) | ORAL | Status: DC | PRN

## 2013-08-01 NOTE — ED Provider Notes (Signed)
CSN: [id number]     Arrival date & time 08/01/13  1329 History  This chart was scribed for Mora Bellman, PA-C, working with Gerhard Munch, MD, by Ellin Mayhew, ED Scribe. This patient was seen in room WTR6/WTR6 and the patient's care was started at 2:28 PM.   Chief Complaint  Patient presents with  . Back Pain    Onset was this am,    The history is provided by the patient. No language interpreter was used.   HPI Comments: Kaitlyn Robertson is a 48 y.o. Female who presents to the Emergency Department with atraumatic L lower back pain with onset this morning. Patient characterizes the pain as a shooting pain that radiates down her L leg which stops at her knee. She is ambulatory as per arrival to the ED. Patient states she has had low back pain a few years ago, but describes it as a separate pain. Patient denies any activities that required exertion yesterday; she states he was watching TV and swept the floor casually. Patient states that sitting down makes the pain worse. She denies any GI/GU symptoms, fever, chills, or vomiting. No bowel or bladder incontinence, hx of CA, or drug abuse. Patient reports a history of HTN and acid reflux.   Past Medical History  Diagnosis Date  . Anxiety   . Hypertension   . GERD (gastroesophageal reflux disease)    Past Surgical History  Procedure Laterality Date  . Cesarean section     History reviewed. No pertinent family history. History  Substance Use Topics  . Smoking status: Current Some Day Smoker -- 0.50 packs/day    Types: Cigarettes  . Smokeless tobacco: Never Used  . Alcohol Use: Yes     Comment: occasionally   OB History   Grav Para Term Preterm Abortions TAB SAB Ect Mult Living                 Review of Systems  Constitutional: Negative for chills.  Respiratory: Negative for shortness of breath.   Gastrointestinal: Negative for nausea, vomiting, abdominal pain, diarrhea and blood in stool.  Endocrine: Negative for polyuria.   Genitourinary: Negative for dysuria, frequency, hematuria and difficulty urinating.  Musculoskeletal: Positive for back pain.  All other systems reviewed and are negative.   Allergies  Review of patient's allergies indicates no known allergies.  Home Medications   Current Outpatient Rx  Name  Route  Sig  Dispense  Refill  . Cholecalciferol (VITAMIN D PO)   Oral   Take 1 tablet by mouth 2 (two) times daily.         Marland Kitchen lisinopril (PRINIVIL,ZESTRIL) 20 MG tablet   Oral   Take 20 mg by mouth daily.         Marland Kitchen RANITIDINE HCL PO   Oral   Take 1 tablet by mouth 2 (two) times daily.          Triage Vitals: BP 126/87  Pulse 83  Temp(Src) 98 F (36.7 C) (Oral)  Resp 16  Wt 260 lb (117.935 kg)  SpO2 99%  LMP 07/04/2013  Physical Exam  Nursing note and vitals reviewed. Constitutional: She is oriented to person, place, and time. She appears well-developed and well-nourished. She appears distressed.  Morbidly obese  HENT:  Head: Normocephalic and atraumatic.  Right Ear: External ear normal.  Left Ear: External ear normal.  Nose: Nose normal.  Mouth/Throat: Oropharynx is clear and moist.  Eyes: Conjunctivae and EOM are normal.  Neck: Normal range  of motion. Neck supple.  Cardiovascular: Normal rate, regular rhythm, normal heart sounds, intact distal pulses and normal pulses.   Pulses:      Posterior tibial pulses are 2+ on the right side, and 2+ on the left side.  Pulmonary/Chest: Effort normal and breath sounds normal. No stridor. No respiratory distress. She has no wheezes. She has no rales.  Abdominal: Soft. She exhibits no distension.  Musculoskeletal: Normal range of motion. She exhibits tenderness.       Back:  TTP over L low back, no bony tenderness, no deformities or step offs  Neurological: She is alert and oriented to person, place, and time. She has normal strength.  Skin: Skin is warm and dry. She is not diaphoretic. No erythema.  Psychiatric: She has a  normal mood and affect. Her behavior is normal.    ED Course  Procedures (including critical care time)  DIAGNOSTIC STUDIES: Oxygen Saturation is 99% on room air, normal by my interpretation.    COORDINATION OF CARE: 2:32 PM-Discussed my suspicion of pain being MSK in nature. Will prescribe pain medication and exercises to strengthen the area. Recommended icing the area and resting. Patient requested UA. Will f/u with patient following analysis and will recommend physical therapist. Treatment plan discussed with patient and patient agrees.  4:09 PM-Discussed negative UA findings. Discussed again that I believe this is likely MSK in nature.   Labs Review Labs Reviewed  URINALYSIS, ROUTINE W REFLEX MICROSCOPIC  POC URINE PREG, ED   MDM   Final diagnoses:  Back pain    Patient with back pain.  No neurological deficits and normal neuro exam.  Patient can walk but states is painful.  No loss of bowel or bladder control.  No concern for cauda equina.  No fever, night sweats, weight loss, h/o cancer, IVDU.  RICE protocol and pain medicine indicated and discussed with patient.    I personally performed the services described in this documentation, which was scribed in my presence. The recorded information has been reviewed and is accurate.  Mora BellmanHannah S Kaesen Rodriguez, PA-C 08/01/13 1625

## 2013-08-01 NOTE — Discharge Instructions (Signed)
Back Pain, Adult °Back pain is very common. The pain often gets better over time. The cause of back pain is usually not dangerous. Most people can learn to manage their back pain on their own.  °HOME CARE  °· Stay active. Start with short walks on flat ground if you can. Try to walk farther each day. °· Do not sit, drive, or stand in one place for more than 30 minutes. Do not stay in bed. °· Do not avoid exercise or work. Activity can help your back heal faster. °· Be careful when you bend or lift an object. Bend at your knees, keep the object close to you, and do not twist. °· Sleep on a firm mattress. Lie on your side, and bend your knees. If you lie on your back, put a pillow under your knees. °· Only take medicines as told by your doctor. °· Put ice on the injured area. °· Put ice in a plastic bag. °· Place a towel between your skin and the bag. °· Leave the ice on for 15-20 minutes, 03-04 times a day for the first 2 to 3 days. After that, you can switch between ice and heat packs. °· Ask your doctor about back exercises or massage. °· Avoid feeling anxious or stressed. Find good ways to deal with stress, such as exercise. °GET HELP RIGHT AWAY IF:  °· Your pain does not go away with rest or medicine. °· Your pain does not go away in 1 week. °· You have new problems. °· You do not feel well. °· The pain spreads into your legs. °· You cannot control when you poop (bowel movement) or pee (urinate). °· Your arms or legs feel weak or lose feeling (numbness). °· You feel sick to your stomach (nauseous) or throw up (vomit). °· You have belly (abdominal) pain. °· You feel like you may pass out (faint). °MAKE SURE YOU:  °· Understand these instructions. °· Will watch your condition. °· Will get help right away if you are not doing well or get worse. °Document Released: 10/29/2007 Document Revised: 08/04/2011 Document Reviewed: 09/30/2010 °ExitCare® Patient Information ©2014 [employer]. ° °Back Exercises °Back  exercises help treat and prevent back injuries. The goal is to increase your strength in your belly (abdominal) and back muscles. These exercises can also help with flexibility. Start these exercises when told by your doctor. °HOME CARE °Back exercises include: °Pelvic Tilt. °· Lie on your back with your knees bent. Tilt your pelvis until the lower part of your back is against the floor. Hold this position 5 to 10 sec. Repeat this exercise 5 to 10 times. °Knee to Chest. °· Pull 1 knee up against your chest and hold for 20 to 30 seconds. Repeat this with the other knee. This may be done with the other leg straight or bent, whichever feels better. Then, pull both knees up against your chest. °Sit-Ups or Curl-Ups. °· Bend your knees 90 degrees. Start with tilting your pelvis, and do a partial, slow sit-up. Only lift your upper half 30 to 45 degrees off the floor. Take at least 2 to 3 seonds for each sit-up. Do not do sit-ups with your knees out straight. If partial sit-ups are difficult, simply do the above but with only tightening your belly (abdominal) muscles and holding it as told. °Hip-Lift. °· Lie on your back with your knees flexed 90 degrees. Push down with your feet and shoulders as you raise your hips 2 inches off the   floor. Hold for 10 seconds, repeat 5 to 10 times. °Back Arches. °· Lie on your stomach. Prop yourself up on bent elbows. Slowly press on your hands, causing an arch in your low back. Repeat 3 to 5 times. °Shoulder-Lifts. °· Lie face down with arms beside your body. Keep hips and belly pressed to floor as you slowly lift your head and shoulders off the floor. °Do not overdo your exercises. Be careful in the beginning. Exercises may cause you some mild back discomfort. If the pain lasts for more than 15 minutes, stop the exercises until you see your doctor. Improvement with exercise for back problems is slow.  °Document Released: 06/14/2010 Document Revised: 08/04/2011 Document Reviewed:  03/13/2011 °ExitCare® Patient Information ©2014 [employer]. ° °

## 2013-08-01 NOTE — ED Notes (Signed)
Pt alert, nad, c/o low back pain, radiates down left leg, onset was this am, denies trauma or injury, hx of low back pain, pt ambulates to room

## 2013-08-01 NOTE — Progress Notes (Signed)
[facility] provided pt with a [employer] Orange [employer] application, highlighting [facility] to help patient establish primary care. CL also provided pt with dental resources.

## 2013-08-02 NOTE — ED Provider Notes (Signed)
  Medical screening examination/treatment/procedure(s) were performed by non-physician practitioner and as supervising physician I was immediately available for consultation/collaboration.     Marisol Glazer, MD 08/02/13 0752 

## 2014-03-06 ENCOUNTER — Emergency Department (HOSPITAL_COMMUNITY)
Admission: EM | Admit: 2014-03-06 | Discharge: 2014-03-07 | Disposition: A | Discharge status: Home / Self Care | Payer: Self-pay | Attending: Emergency Medicine | Admitting: Emergency Medicine

## 2014-03-06 ENCOUNTER — Encounter (HOSPITAL_COMMUNITY): Payer: Self-pay | Admitting: Emergency Medicine

## 2014-03-06 DIAGNOSIS — I10 Essential (primary) hypertension: Secondary | ICD-10-CM | POA: Insufficient documentation

## 2014-03-06 DIAGNOSIS — K047 Periapical abscess without sinus: Secondary | ICD-10-CM | POA: Insufficient documentation

## 2014-03-06 DIAGNOSIS — Z3202 Encounter for pregnancy test, result negative: Secondary | ICD-10-CM | POA: Insufficient documentation

## 2014-03-06 DIAGNOSIS — Z8659 Personal history of other mental and behavioral disorders: Secondary | ICD-10-CM | POA: Insufficient documentation

## 2014-03-06 DIAGNOSIS — M791 Myalgia: Secondary | ICD-10-CM | POA: Insufficient documentation

## 2014-03-06 DIAGNOSIS — R109 Unspecified abdominal pain: Secondary | ICD-10-CM | POA: Insufficient documentation

## 2014-03-06 DIAGNOSIS — Z79899 Other long term (current) drug therapy: Secondary | ICD-10-CM | POA: Insufficient documentation

## 2014-03-06 DIAGNOSIS — Z720 Tobacco use: Secondary | ICD-10-CM | POA: Insufficient documentation

## 2014-03-06 DIAGNOSIS — R0600 Dyspnea, unspecified: Secondary | ICD-10-CM | POA: Insufficient documentation

## 2014-03-06 DIAGNOSIS — K219 Gastro-esophageal reflux disease without esophagitis: Secondary | ICD-10-CM | POA: Insufficient documentation

## 2014-03-06 LAB — CBC WITH DIFFERENTIAL/PLATELET
BASOS ABS: 0 10*3/uL (ref 0.0–0.1)
BASOS PCT: 0 % (ref 0–1)
EOS ABS: 0 10*3/uL (ref 0.0–0.7)
Eosinophils Relative: 0 % (ref 0–5)
HCT: 42.6 % (ref 36.0–46.0)
Hemoglobin: 14.2 g/dL (ref 12.0–15.0)
Lymphocytes Relative: 19 % (ref 12–46)
Lymphs Abs: 2.4 10*3/uL (ref 0.7–4.0)
MCH: 25.4 pg — AB (ref 26.0–34.0)
MCHC: 33.3 g/dL (ref 30.0–36.0)
MCV: 76.3 fL — ABNORMAL LOW (ref 78.0–100.0)
Monocytes Absolute: 1.2 10*3/uL — ABNORMAL HIGH (ref 0.1–1.0)
Monocytes Relative: 10 % (ref 3–12)
NEUTROS PCT: 71 % (ref 43–77)
Neutro Abs: 8.8 10*3/uL — ABNORMAL HIGH (ref 1.7–7.7)
PLATELETS: 179 10*3/uL (ref 150–400)
RBC: 5.58 MIL/uL — ABNORMAL HIGH (ref 3.87–5.11)
RDW: 14.8 % (ref 11.5–15.5)
WBC: 12.4 10*3/uL — ABNORMAL HIGH (ref 4.0–10.5)

## 2014-03-06 LAB — COMPREHENSIVE METABOLIC PANEL
ALBUMIN: 3.6 g/dL (ref 3.5–5.2)
ALK PHOS: 63 U/L (ref 39–117)
ALT: 8 U/L (ref 0–35)
ANION GAP: 15 (ref 5–15)
AST: 11 U/L (ref 0–37)
BUN: 6 mg/dL (ref 6–23)
CO2: 24 mEq/L (ref 19–32)
Calcium: 9.3 mg/dL (ref 8.4–10.5)
Chloride: 96 mEq/L (ref 96–112)
Creatinine, Ser: 0.82 mg/dL (ref 0.50–1.10)
GFR calc Af Amer: >90 mL/min (ref >90)
GFR calc non Af Amer: 84 mL/min — ABNORMAL LOW (ref >90)
Glucose, Bld: 114 mg/dL — ABNORMAL HIGH (ref 70–99)
POTASSIUM: 3.5 meq/L — AB (ref 3.7–5.3)
SODIUM: 135 meq/L — AB (ref 137–147)
TOTAL PROTEIN: 8 g/dL (ref 6.0–8.3)
Total Bilirubin: 0.5 mg/dL (ref 0.3–1.2)

## 2014-03-06 NOTE — ED Notes (Signed)
Pt states that she can feel an abscess coming up to her upper lip under nostril area; pt c/o fatigue; generalized malaise; headache, nausea

## 2014-03-07 LAB — URINALYSIS, ROUTINE W REFLEX MICROSCOPIC
Bilirubin Urine: NEGATIVE
GLUCOSE, UA: NEGATIVE mg/dL
Ketones, ur: 15 mg/dL — AB
Leukocytes, UA: NEGATIVE
Nitrite: NEGATIVE
Protein, ur: 30 mg/dL — AB
SPECIFIC GRAVITY, URINE: 1.015 (ref 1.005–1.030)
UROBILINOGEN UA: 0.2 mg/dL (ref 0.0–1.0)
pH: 5.5 (ref 5.0–8.0)

## 2014-03-07 LAB — URINE MICROSCOPIC-ADD ON

## 2014-03-07 LAB — PREGNANCY, URINE: Preg Test, Ur: NEGATIVE

## 2014-03-07 MED ORDER — PENICILLIN V POTASSIUM 500 MG PO TABS: 500.0000 mg | ORAL_TABLET | Freq: Four times a day (QID) | ORAL | Status: DC

## 2014-03-07 MED ORDER — KETOROLAC TROMETHAMINE 30 MG/ML IJ SOLN
30.0000 mg | Freq: Once | INTRAMUSCULAR | Status: AC
  Administered 2014-03-07: 30 mg via INTRAMUSCULAR
  Filled 2014-03-07: qty 1

## 2014-03-07 MED ORDER — TRAMADOL HCL 50 MG PO TABS: 50.0000 mg | ORAL_TABLET | Freq: Four times a day (QID) | ORAL | Status: DC | PRN

## 2014-03-07 MED ORDER — PENICILLIN V POTASSIUM 500 MG PO TABS
500.0000 mg | ORAL_TABLET | Freq: Once | ORAL | Status: AC
  Administered 2014-03-07: 500 mg via ORAL
  Filled 2014-03-07: qty 1

## 2014-03-07 NOTE — ED Notes (Signed)
Patient reports she is experiencing lower abd. Pt is unable to described pain, except to say "it hurts". Patient reports her last bowel movement was on Saturday and she is having to strain, which is not normal for her. Pt denies any blood in stool. Also, patient report she has an abscess above her upper lip that attends to her nose. No signs of redness or swelling.

## 2014-03-07 NOTE — Discharge Instructions (Signed)
Dental Abscess A dental abscess is a collection of infected fluid (pus) from a bacterial infection in the inner part of the tooth (pulp). It usually occurs at the end of the tooth's root.  CAUSES   Severe tooth decay.  Trauma to the tooth that allows bacteria to enter into the pulp, such as a broken or chipped tooth. SYMPTOMS   Severe pain in and around the infected tooth.  Swelling and redness around the abscessed tooth or in the mouth or face.  Tenderness.  Pus drainage.  Bad breath.  Bitter taste in the mouth.  Difficulty swallowing.  Difficulty opening the mouth.  Nausea.  Vomiting.  Chills.  Swollen neck glands. DIAGNOSIS   A medical and dental history will be taken.  An examination will be performed by tapping on the abscessed tooth.  X-rays may be taken of the tooth to identify the abscess. TREATMENT The goal of treatment is to eliminate the infection. You may be prescribed antibiotic medicine to stop the infection from spreading. A root canal may be performed to save the tooth. If the tooth cannot be saved, it may be pulled (extracted) and the abscess may be drained.  HOME CARE INSTRUCTIONS  Only take over-the-counter or prescription medicines for pain, fever, or discomfort as directed by your caregiver.  Rinse your mouth (gargle) often with salt water ( tsp salt in 8 oz [250 ml] of warm water) to relieve pain or swelling.  Do not drive after taking pain medicine (narcotics).  Do not apply heat to the outside of your face.  Return to your dentist for further treatment as directed. SEEK MEDICAL CARE IF:  Your pain is not helped by medicine.  Your pain is getting worse instead of better. SEEK IMMEDIATE MEDICAL CARE IF:  You have a fever or persistent symptoms for more than 2-3 days.  You have a fever and your symptoms suddenly get worse.  You have chills or a very bad headache.  You have problems breathing or swallowing.  You have trouble  opening your mouth.  You have swelling in the neck or around the eye. Document Released: 05/12/2005 Document Revised: 02/04/2012 Document Reviewed: 08/20/2010 [facility] Patient Information 2015 [employer] [place]. This information is not intended to replace advice given to you by your health care provider. Make sure you discuss any questions you have with your health care provider. Please take the antibiotic as directed [place] [facility] [place] for definitive dental care

## 2014-03-07 NOTE — ED Provider Notes (Signed)
CSN: [id number]     Arrival date & time 03/06/14  2009 History   First MD Initiated Contact with Patient 03/07/14 0009     Chief Complaint  Patient presents with  . Abscess  . Abdominal Pain     (Consider location/radiation/quality/duration/timing/severity/associated sxs/prior Treatment) Patient is a 48 y.o. female presenting with abscess and abdominal pain. The history is provided by the patient.  Abscess Location:  Mouth Mouth abscess location:  Upper gingiva Abscess quality: induration, painful and redness   Abscess quality: not draining, no fluctuance and not weeping   Red streaking: no   Progression:  Worsening Pain details:    Quality:  Aching   Severity:  Mild   Timing:  Constant   Progression:  Worsening Chronicity:  Recurrent Context: not diabetes, not immunosuppression, not injected drug use, not insect bite/sting and not skin injury   Relieved by:  Nothing Ineffective treatments:  Warm compresses and cold compresses Associated symptoms: fatigue   Associated symptoms: no anorexia, no fever, no headaches, no nausea and no vomiting   Abdominal Pain Associated symptoms: fatigue   Associated symptoms: no anorexia, no fever, no nausea, no shortness of breath and no vomiting     Past Medical History  Diagnosis Date  . Anxiety   . Hypertension   . GERD (gastroesophageal reflux disease)    Past Surgical History  Procedure Laterality Date  . Cesarean section     No family history on file. History  Substance Use Topics  . Smoking status: Current Some Day Smoker -- 0.50 packs/day    Types: Cigarettes  . Smokeless tobacco: Never Used  . Alcohol Use: Yes     Comment: occasionally   OB History   Grav Para Term Preterm Abortions TAB SAB Ect Mult Living                 Review of Systems  Constitutional: Positive for fatigue. Negative for fever.  HENT: Positive for dental problem. Negative for drooling and facial swelling.   Respiratory: Negative for shortness  of breath.   Gastrointestinal: Positive for abdominal pain. Negative for nausea, vomiting and anorexia.  Musculoskeletal: Positive for myalgias.  Neurological: Negative for headaches.  All other systems reviewed and are negative.     Allergies  Citrus  Home Medications   Prior to Admission medications   Medication Sig Start Date End Date Taking? Authorizing Provider  benzocaine (ORAJEL) 10 % mucosal gel Use as directed 1 application in the mouth or throat as needed for mouth pain.   Yes Historical Provider, MD  Cholecalciferol (VITAMIN D PO) Take 1 tablet by mouth 2 (two) times daily.   Yes Historical Provider, MD  lisinopril (PRINIVIL,ZESTRIL) 20 MG tablet Take 20 mg by mouth daily.   Yes Historical Provider, MD  RANITIDINE HCL PO Take 75 mg by mouth 2 (two) times daily.    Yes Historical Provider, MD  penicillin v potassium (VEETID) 500 MG tablet Take 1 tablet (500 mg total) by mouth 4 (four) times daily. 03/07/14   Arman FilterGail K Monia Timmers, NP  traMADol (ULTRAM) 50 MG tablet Take 1 tablet (50 mg total) by mouth every 6 (six) hours as needed. 03/07/14   Arman FilterGail K Leighanna Kirn, NP   BP 107/65  Pulse 99  Temp(Src) 98.3 F (36.8 C) (Oral)  Resp 18  SpO2 97%  LMP 02/28/2014 Physical Exam  Nursing note and vitals reviewed. Constitutional: She is oriented to person, place, and time. She appears well-developed and well-nourished.  HENT:  Head:  Normocephalic and atraumatic.  Mouth/Throat:    Eyes: Pupils are equal, round, and reactive to light.  Neck: Normal range of motion.  Cardiovascular: Normal rate and regular rhythm.   Pulmonary/Chest: She is in respiratory distress.  Abdominal: Soft. Bowel sounds are normal.  Neurological: She is alert and oriented to person, place, and time.  Skin: Skin is warm. No rash noted.    ED Course  Procedures (including critical care time) Labs Review Labs Reviewed  CBC WITH DIFFERENTIAL - Abnormal; Notable for the following:    WBC 12.4 (*)    RBC 5.58  (*)    MCV 76.3 (*)    MCH 25.4 (*)    Neutro Abs 8.8 (*)    Monocytes Absolute 1.2 (*)    All other components within normal limits  COMPREHENSIVE METABOLIC PANEL - Abnormal; Notable for the following:    Sodium 135 (*)    Potassium 3.5 (*)    Glucose, Bld 114 (*)    GFR calc non Af Amer 84 (*)    All other components within normal limits  URINALYSIS, ROUTINE W REFLEX MICROSCOPIC - Abnormal; Notable for the following:    Color, Urine AMBER (*)    APPearance CLOUDY (*)    Hgb urine dipstick MODERATE (*)    Ketones, ur 15 (*)    Protein, ur 30 (*)    All other components within normal limits  URINE MICROSCOPIC-ADD ON - Abnormal; Notable for the following:    Squamous Epithelial / LPF MANY (*)    Bacteria, UA MANY (*)    All other components within normal limits  PREGNANCY, URINE    Imaging Review No results found.   EKG Interpretation None      MDM   Final diagnoses:  Dental abscess         Arman FilterGail K Kenneisha Cochrane, NP 03/07/14 [phone]

## 2014-03-07 NOTE — ED Provider Notes (Signed)
Medical screening examination/treatment/procedure(s) were performed by non-physician practitioner and as supervising physician I was immediately available for consultation/collaboration.   EKG Interpretation None        Tomasita CrumbleAdeleke Chimere Klingensmith, MD 03/07/14 1358

## 2014-03-10 ENCOUNTER — Encounter (HOSPITAL_COMMUNITY): Payer: Self-pay | Admitting: Emergency Medicine

## 2014-03-10 ENCOUNTER — Emergency Department (HOSPITAL_COMMUNITY)
Admission: EM | Admit: 2014-03-10 | Discharge: 2014-03-10 | Disposition: A | Discharge status: Home / Self Care | Payer: Self-pay | Attending: Emergency Medicine | Admitting: Emergency Medicine

## 2014-03-10 DIAGNOSIS — Z79899 Other long term (current) drug therapy: Secondary | ICD-10-CM | POA: Insufficient documentation

## 2014-03-10 DIAGNOSIS — K0889 Other specified disorders of teeth and supporting structures: Secondary | ICD-10-CM

## 2014-03-10 DIAGNOSIS — K088 Other specified disorders of teeth and supporting structures: Secondary | ICD-10-CM | POA: Insufficient documentation

## 2014-03-10 DIAGNOSIS — F419 Anxiety disorder, unspecified: Secondary | ICD-10-CM | POA: Insufficient documentation

## 2014-03-10 DIAGNOSIS — I10 Essential (primary) hypertension: Secondary | ICD-10-CM | POA: Insufficient documentation

## 2014-03-10 DIAGNOSIS — Z720 Tobacco use: Secondary | ICD-10-CM | POA: Insufficient documentation

## 2014-03-10 DIAGNOSIS — Z8719 Personal history of other diseases of the digestive system: Secondary | ICD-10-CM | POA: Insufficient documentation

## 2014-03-10 MED ORDER — PENICILLIN V POTASSIUM 250 MG/5ML PO SOLR
125.0000 mg | Freq: Once | ORAL | Status: AC
  Administered 2014-03-10: 125 mg via ORAL
  Filled 2014-03-10: qty 5

## 2014-03-10 MED ORDER — HYDROCODONE-ACETAMINOPHEN 5-325 MG PO TABS
2.0000 | ORAL_TABLET | Freq: Once | ORAL | Status: AC
  Administered 2014-03-10: 2 via ORAL
  Filled 2014-03-10: qty 2

## 2014-03-10 NOTE — ED Provider Notes (Signed)
Medical screening examination/treatment/procedure(s) were performed by non-physician practitioner and as supervising physician I was immediately available for consultation/collaboration.  Flint MelterElliott L Jurni Cesaro, MD 03/10/14 [phone]

## 2014-03-10 NOTE — Discharge Instructions (Signed)
Dental Care and Dentist Visits °Dental care supports good overall health. Regular dental visits can also help you avoid dental pain, bleeding, infection, and other more serious health problems in the future. It is important to keep the mouth healthy because diseases in the teeth, gums, and other oral tissues can spread to other areas of the body. Some problems, such as diabetes, heart disease, and pre-term labor have been associated with poor oral health.  °See your dentist every 6 months. If you experience emergency problems such as a toothache or broken tooth, go to the dentist right away. If you see your dentist regularly, you may catch problems early. It is easier to be treated for problems in the early stages.  °WHAT TO EXPECT AT A DENTIST VISIT  °Your dentist will look for many common oral health problems and recommend proper treatment. At your regular dental visit, you can expect: °· Gentle cleaning of the teeth and gums. This includes scraping and polishing. This helps to remove the sticky substance around the teeth and gums (plaque). Plaque forms in the mouth shortly after eating. Over time, plaque hardens on the teeth as tartar. If tartar is not removed regularly, it can cause problems. Cleaning also helps remove stains. °· Periodic X-rays. These pictures of the teeth and supporting bone will help your dentist assess the health of your teeth. °· Periodic fluoride treatments. Fluoride is a natural mineral shown to help strengthen teeth. Fluoride treatment involves applying a fluoride gel or varnish to the teeth. It is most commonly done in children. °· Examination of the mouth, tongue, jaws, teeth, and gums to look for any oral health problems, such as: °¨ Cavities (dental caries). This is decay on the tooth caused by plaque, sugar, and acid in the mouth. It is best to catch a cavity when it is small. °¨ Inflammation of the gums caused by plaque buildup (gingivitis). °¨ Problems with the mouth or malformed  or misaligned teeth. °¨ Oral cancer or other diseases of the soft tissues or jaws.  °KEEP YOUR TEETH AND GUMS HEALTHY °For healthy teeth and gums, follow these general guidelines as well as your dentist's specific advice: °· Have your teeth professionally cleaned at the dentist every 6 months. °· Brush twice daily with a fluoride toothpaste. °· Floss your teeth daily.  °· Ask your dentist if you need fluoride supplements, treatments, or fluoride toothpaste. °· Eat a healthy diet. Reduce foods and drinks with added sugar. °· Avoid smoking. °TREATMENT FOR ORAL HEALTH PROBLEMS °If you have oral health problems, treatment varies depending on the conditions present in your teeth and gums. °· Your caregiver will most likely recommend good oral hygiene at each visit. °· For cavities, gingivitis, or other oral health disease, your caregiver will perform a procedure to treat the problem. This is typically done at a separate appointment. Sometimes your caregiver will refer you to another dental specialist for specific tooth problems or for surgery. °SEEK IMMEDIATE DENTAL CARE IF: °· You have pain, bleeding, or soreness in the gum, tooth, jaw, or mouth area. °· A permanent tooth becomes loose or separated from the gum socket. °· You experience a blow or injury to the mouth or jaw area. °Document Released: 01/22/2011 Document Revised: 08/04/2011 Document Reviewed: 01/22/2011 °ExitCare® Patient Information ©2015 [employer]. This information is not intended to replace advice given to you by your health care provider. Make sure you discuss any questions you have with your health care provider. ° °Emergency Department Resource Guide °1) Find a Doctor   and Pay Out of Pocket °Although you won't have to find out who is covered by your insurance plan, it is a good idea to ask around and get recommendations. You will then need to call the office and see if the doctor you have chosen will accept you as a new patient and what types of  options they offer for patients who are self-pay. Some doctors offer discounts or will set up payment plans for their patients who do not have insurance, but you will need to ask so you aren't surprised when you get to your appointment. ° °2) Contact Your Local Health Department °Not all health departments have doctors that can see patients for sick visits, but many do, so it is worth a call to see if yours does. If you don't know where your local health department is, you can check in your phone book. The CDC also has a tool to help you locate your state's health department, and many state websites also have listings of all of their local health departments. ° °3) Find a Walk-in Clinic °If your illness is not likely to be very severe or complicated, you may want to try a walk in clinic. These are popping up all over the country in pharmacies, drugstores, and shopping centers. They're usually staffed by nurse practitioners or physician assistants that have been trained to treat common illnesses and complaints. They're usually fairly quick and inexpensive. However, if you have serious medical issues or chronic medical problems, these are probably not your best option. ° °No Primary Care Doctor: °- Call [facility] at  832-8000 - they can help you locate a primary care doctor that  accepts your insurance, provides certain services, etc. °- Physician Referral Service- 1-800-533-3463 ° °Chronic Pain Problems: °Organization         Address  Phone   Notes  °[facility]  (336) 297-2271 Patients need to be referred by their primary care doctor.  ° °Medication Assistance: °Organization         Address  Phone   Notes  °[place] [facility] [address] °[place], [place] [address] (336) 641-8030 --Must be a resident of [place] °-- Must have NO insurance coverage whatsoever (no Medicaid/ Medicare, etc.) °-- The pt. MUST have a primary care doctor that directs  their care regularly and follows them in the community °  °[facility]  (866) 331-1348   °[employer]  (888) 892-1162   ° °Agencies that provide inexpensive medical care: °Organization         Address  Phone   Notes  °[facility]  (336) 832-8035   °[facility]    (336) 832-7272   °[facility] [address] °[place], [place] [address] (336) 832-4777   °[facility] [address], °[place] (336) 271-4999   °[facility]    (336) 373-0678   °[facility]    (336) 272-1050   °[facility] ° [address], [place] Phone:  (336) 832-4444, Fax:  (336) 832-4440 Hours of Operation:  9 am - 6 pm, M-F.  Also accepts Medicaid/Medicare and self-pay.  °[facility] ° [address],  Phone: (336) 832-3150, Fax: (336) 832-3151. Hours of Operation:  8:30 am - 5:30 pm, M-F.  Also accepts Medicaid and self-pay.  °[facility] [place] [address], [place] Phone: (336) 878-6027   °  [facility] [address], [place], [place] (336)723-1848, Ext. 123 Mondays & Thursdays: 7-9 AM.  First 15 patients are seen on a first come, first serve basis. °  ° °Medicaid-accepting [place] Providers: ° °Organization         Address  Phone   Notes  °[facility] [address], [place] (336) 641-2100 Also accepts self-pay patients.  °[facility] [address], [place] ° (336) 856-9996   °[facility] [address], [place] (336) 288-8857   °[facility] [address] [place] [address], [place] (336) 299-7000   °Veita Bland [address], [place]  ° (336) 373-1557 Only accepts [place] Access Medicaid patients after they have their name applied to their card.  ° °Self-Pay (no insurance) in [place]: ° °Organization          Address  Phone   Notes  °Sickle Cell Patients, [facility] [address], [place] (336) 832-1970   °[facility] [address], [place] (336) 832-4400   °[facility] ° [address], [place] (336) 992-4800   °[facility]/Dr. Osei-Bonsu ° [address] [place] [address], [place] or [address], [place] (336) 841-8500 Phone number for both [place] and [place] locations is the same.  °[facility] [address], [place] (336) 299-0000   °[facility] [address] [place] [address], [place] or [address] (336) 852-7530 °(336) 878-2260   °[facility] [address], [place] (336) 350-1642, phone; (336) 294-5005, fax Sees patients 1st and 3rd Saturday of every month.  Must not qualify for public or private insurance (i.e. Medicaid, Medicare, [place] Health Choice, Veterans' Benefits) • Household income should be no more than 200% of the poverty level •The clinic cannot treat you if you are pregnant or think you are pregnant • Sexually transmitted diseases are not treated at the clinic.  ° ° °Dental Care: °Organization         Address  Phone  Notes  °[facility] [facility] [address],  (336) 641-6152 Accepts children up to age 21 who are enrolled in Medicaid or [place] Health Choice; pregnant women with a Medicaid card; and children who have applied for Medicaid or [place] Health Choice, but were declined, whose parents can pay a reduced fee at time of service.  °[facility] [place]  [address], [place] (336) 641-7733 Accepts children up to age 21 who are enrolled in Medicaid or [place] Health Choice; pregnant women with a Medicaid card; and children who have applied for Medicaid or [place] Health Choice, but were declined, whose parents can pay a reduced fee at time of  service.  °[facility] ° [address],  (336) 641-4533 Patients are seen by appointment only. Walk-ins are not accepted. [facility] will see patients 18 years of age and older. °Monday - Tuesday (8am-5pm) °Most Wednesdays (8:30-5pm) °$30 per visit, cash only  °[facility] ° [address], [place] (336) 641-4533 Patients are seen by appointment only. Walk-ins are not accepted. [facility] will see patients 18 years of age and older. °One Wednesday Evening (Monthly: Volunteer Based).  $30 per visit,   cash only  °[facility]  (919) 537-3737 for adults; Children under age 4, call Graduate Pediatric Dentistry at (919) [phone]. Children aged 4-14, please call (919) [phone] to request a pediatric application. ° Dental services are provided in all areas of dental care including fillings, crowns and bridges, complete and partial dentures, implants, gum treatment, root canals, and extractions. Preventive care is also provided. Treatment is provided to both adults and children. °Patients are selected via a lottery and there is often a waiting list. °  °[facility] [address], °[place] ° (336) 763-8833 [email or web address] °  °[facility] [address], [place], [place] (336)723-1848, Ext. 123 Second and Fourth Thursday of each month, opens at 6:30 AM; Clinic ends at 9 AM.  Patients are seen on a first-come first-served basis, and a limited number are seen during each clinic.  ° °[facility] ° [address], [place], [place] (336) 723-7904   Eligibility Requirements °You must have lived in [place], [place], or [place] counties for at least the last three months. °  You cannot be eligible for state or federal sponsored healthcare insurance, including Veterans Administration, Medicaid, or Medicare. °  You generally cannot be eligible for healthcare insurance through your employer.   °  How to apply: °Eligibility screenings are held every Tuesday and Wednesday afternoon from 1:00 pm until 4:00 pm. You do not need an appointment for the interview!  °[facility] [address], [place], [place] 336-631-2330   °[facility]  336-342-8273   °[facility]  336-703-3100   °[facility]  336-570-6415   ° °Behavioral Health Resources in the Community: °Intensive Outpatient Programs °Organization         Address  Phone  Notes  °[facility] [address], [place], [place] 336-878-6098   °[facility] [address], [place], [place] 336-832-9800   °[facility] [address], [place], [place] ° 336-882-2125   °[facility] [address],  °[place], [place] 1-800-853-5163 or 336-641-4981   °Substance Abuse Resources °Organization         Address  Phone  Notes  °[facility]  336-882-2125   °[facility]  336-784-9470   °[facility]  336-285-9073   °[facility]  336-845-3988   °Residential & Outpatient Substance Abuse Program  1-800-659-3381   °Psychological Services °Organization         Address  Phone  Notes  °[facility]  336- 832-9600   °[facility]  336- 378-7881   °[facility] [address], [place] 1-800-853-5163 or 336-641-4981   ° °Mobile Crisis Teams °Organization         Address  Phone  Notes  °[facility], Mobile Crisis Care Unit  1-877-626-1772   °Assertive °[facility] ° [address] [place], [place] 336-834-9664   °Sharon DeEsch [address] °[place] 336-554-5454   ° °Self-Help/Support Groups °Organization         Address  Phone             Notes  °[facility]  - variety of support groups  336- 373-1402 Call for more information  °Narcotics Anonymous (NA), [facility] [address], °[place]   2 meetings at this location  ° °Residential Treatment Programs °Organization           Address  Phone  Notes  °[facility] [address],    °[place]  1-866-801-8205   °[facility] ° [address], [place], [place] 704-293-8524   °[facility] [address], [place] 336-845-3988 Admissions: 8am-3pm M-F  °[facility] [address]    °[place], [place] 336-841-1104   °[facility] [address], [place], [place] 336-379-7146   °[facility] [address]  °[place], [place] 336-285-9073   °[facility] Intensive Outpatient [address], [place], [place] 336-852-3033   °[facility] [address]  °[place], [place] 1-877-615-2722 or 336-784-9470   °[facility] [address] [place], [place] 336-227-7417 Accepts Medicaid  °[facility] [address]  °[place] 1-800-659-3381 Substance Abuse/Addiction Treatment  ° °[place] Behavioral Health Resources °Organization         Address  Phone  Notes  °[facility]  (888) 581-9988   °Julie Brannon, PhD [address] [place], [place]   (336) 349-5553 or (336) 951-0000   °[facility]   [address] °[place], [place] (336) 349-4454   °[facility] [address], [place], [place] (336) 342-8316 Insurance/Medicaid/sponsorship through [facility]  °[facility] [address]                                    [place], [place] (336) 342-8316 Therapy/tele-psych/case  °[facility] [address]  ° [place], [place] (336) 349-2233    °Dr. Arfeen  (336) 349-4544   °[facility] [place]  [employer] [place] [facility] 1) [address], [place] °2) [address], [place] °3)  [address], [place] (336) 349-3220 °(336) 342-7768 ° °(336) 342-8140   °[place] [facility]  [facility] (336) 342-1394 or (336) 342-3537 (After Hours)    ° °

## 2014-03-10 NOTE — ED Notes (Signed)
Pt reports upper incisor dental pain. Was seen earlier in the week for same, but has not been able to afford prescription.

## 2014-03-10 NOTE — ED Provider Notes (Signed)
CSN: [id number]     Arrival date & time 03/10/14  1810 History  This chart was scribed for non-physician practitioner, Joycie PeekBenjamin Cache Decoursey, PA-C working with Flint MelterElliott L [place]z, MD by Luisa DagoPriscilla Tutu, ED scribe. This patient was seen in room WTR8/WTR8 and the patient's care was started at 6:41 PM.    Chief Complaint  Patient presents with  . Dental Pain   The history is provided by the patient. No language interpreter was used.   HPI Comments: Kaitlyn Robertson is a 48 y.o. female who presents to the Emergency Department complaining of a gradual onset dental pain that started approximately 6 days ago. Pt states that she was seen in the ED on Monday and was told that she had a dental abscess. She states that the associated pain has been worsening. Nothing makes the pain better but biting, chewing or moving her jaw certain ways aggravates the pain. Kaitlyn Robertson also endorses lip swelling which she states that has gone down since she was seen here on Monday. Pt is able to tolerate liquids and solid foods. Pt does not have insurance. She denies any fever, chills, or diaphoresis.   [facility]  Past Medical History  Diagnosis Date  . Anxiety   . Hypertension   . GERD (gastroesophageal reflux disease)    Past Surgical History  Procedure Laterality Date  . Cesarean section     No family history on file. History  Substance Use Topics  . Smoking status: Current Some Day Smoker -- 0.50 packs/day    Types: Cigarettes  . Smokeless tobacco: Never Used  . Alcohol Use: Yes     Comment: occasionally   OB History   Grav Para Term Preterm Abortions TAB SAB Ect Mult Living                 Review of Systems  Constitutional: Negative for fever, chills, diaphoresis, appetite change and fatigue.  HENT: Negative for mouth sores, sore throat and trouble swallowing.   Eyes: Negative for visual disturbance.  Respiratory: Negative for cough, chest tightness,  shortness of breath and wheezing.   Cardiovascular: Negative for chest pain.  Gastrointestinal: Negative for nausea, vomiting, abdominal pain, diarrhea and abdominal distention.  Endocrine: Negative for polydipsia, polyphagia and polyuria.  Genitourinary: Negative for dysuria, frequency and hematuria.  Musculoskeletal: Negative for gait problem.  Skin: Negative for color change, pallor and rash.  Neurological: Negative for dizziness, syncope, light-headedness and headaches.  Hematological: Does not bruise/bleed easily.  Psychiatric/Behavioral: Negative for behavioral problems and confusion.      Allergies  Citrus  Home Medications   Prior to Admission medications   Medication Sig Start Date End Date Taking? Authorizing Provider  benzocaine (ORAJEL) 10 % mucosal gel Use as directed 1 application in the mouth or throat as needed for mouth pain.   Yes Historical Provider, MD  cholecalciferol (VITAMIN D) 1000 UNITS tablet Take 1,000 Units by mouth 2 (two) times daily.   Yes Historical Provider, MD  ibuprofen (ADVIL,MOTRIN) 200 MG tablet Take 400 mg by mouth every 6 (six) hours as needed for moderate pain.   Yes Historical Provider, MD  lisinopril (PRINIVIL,ZESTRIL) 20 MG tablet Take 20 mg by mouth daily.   Yes Historical Provider, MD  RANITIDINE HCL PO Take 75 mg by mouth 2 (two) times daily.    Yes Historical Provider, MD  penicillin v potassium (VEETID) 500 MG tablet Take 1 tablet (500 mg total) by  mouth 4 (four) times daily. 03/07/14   Arman FilterGail K Schulz, NP  traMADol (ULTRAM) 50 MG tablet Take 1 tablet (50 mg total) by mouth every 6 (six) hours as needed. 03/07/14   Arman FilterGail K Schulz, NP   Triage Vitals:BP 133/76  Pulse 93  Temp(Src) 98.2 F (36.8 C) (Oral)  Resp 16  SpO2 100%  LMP 02/28/2014   Physical Exam  Nursing note and vitals reviewed. Constitutional: She is oriented to person, place, and time. She appears well-developed and well-nourished. No distress.  HENT:  Head:  Normocephalic and atraumatic.  Left upper lip is indurated with no erythema. Mild swelling noted. Pt has overall poor dentition with multiple fractured teeth, dental carries.   Eyes: Conjunctivae and EOM are normal. Pupils are equal, round, and reactive to light. Right eye exhibits no discharge. Left eye exhibits no discharge. No scleral icterus.  Neck: Neck supple.  Cardiovascular: Normal rate, regular rhythm and normal heart sounds.  Exam reveals no gallop and no friction rub.   No murmur heard. Pulmonary/Chest: Effort normal and breath sounds normal. No respiratory distress. She has no wheezes. She has no rales.  Musculoskeletal: Normal range of motion.  Lymphadenopathy:  No lymphadenopathy.   Neurological: She is alert and oriented to person, place, and time.  Skin: Skin is warm and dry.  Psychiatric: She has a normal mood and affect. Her behavior is normal.    ED Course  Procedures (including critical care time)  DIAGNOSTIC STUDIES: Oxygen Saturation is 100% on RA, normal by my interpretation.    COORDINATION OF CARE: 6:47 PM- Will order pain medication and antibiotic. Pt advised of plan for treatment and pt agrees.  Labs Review Labs Reviewed - No data to display  Imaging Review No results found.   EKG Interpretation None      MDM  Vitals stable - WNL -afebrile Pt resting comfortably in ED. pain managed in ED, antibiotics administered in ED. Patient states she still has prescriptions from previous visit this week. Resources given for financial assistance and importance of followup with dentistry. Referral for dentistry given PE patient has poor overall dentition with multiple caries and fractured teeth. Will need dentistry's attention. Low concern for peritonsillar abscess or Ludwig angina or other emergent pathology  Discussed f/u with PCP and return precautions, pt very amenable to plan. Patient stable, in good condition and is appropriate for discharge  Final  diagnoses:  Tooth pain      I personally performed the services described in this documentation, which was scribed in my presence. The recorded information has been reviewed and is accurate.    Sharlene MottsBenjamin W Senon Nixon, PA-C 03/10/14 1935

## 2014-03-11 ENCOUNTER — Encounter (HOSPITAL_COMMUNITY): Payer: Self-pay | Admitting: Emergency Medicine

## 2014-03-11 ENCOUNTER — Emergency Department (HOSPITAL_COMMUNITY)
Admission: EM | Admit: 2014-03-11 | Discharge: 2014-03-11 | Disposition: A | Discharge status: Home / Self Care | Payer: Self-pay | Attending: Emergency Medicine | Admitting: Emergency Medicine

## 2014-03-11 DIAGNOSIS — K047 Periapical abscess without sinus: Secondary | ICD-10-CM | POA: Insufficient documentation

## 2014-03-11 DIAGNOSIS — A679 Pinta, unspecified: Secondary | ICD-10-CM | POA: Insufficient documentation

## 2014-03-11 DIAGNOSIS — Z720 Tobacco use: Secondary | ICD-10-CM | POA: Insufficient documentation

## 2014-03-11 DIAGNOSIS — I10 Essential (primary) hypertension: Secondary | ICD-10-CM | POA: Insufficient documentation

## 2014-03-11 DIAGNOSIS — Z79899 Other long term (current) drug therapy: Secondary | ICD-10-CM | POA: Insufficient documentation

## 2014-03-11 DIAGNOSIS — F419 Anxiety disorder, unspecified: Secondary | ICD-10-CM | POA: Insufficient documentation

## 2014-03-11 DIAGNOSIS — K219 Gastro-esophageal reflux disease without esophagitis: Secondary | ICD-10-CM | POA: Insufficient documentation

## 2014-03-11 MED ORDER — LIDOCAINE HCL (PF) 1 % IJ SOLN: 2.0000 mL | Freq: Once | INTRAMUSCULAR | Status: DC

## 2014-03-11 MED ORDER — OXYCODONE-ACETAMINOPHEN 5-325 MG PO TABS
1.0000 | ORAL_TABLET | Freq: Once | ORAL | Status: AC
  Administered 2014-03-11: 1 via ORAL
  Filled 2014-03-11: qty 1

## 2014-03-11 MED ORDER — CEFTRIAXONE SODIUM 1 G IJ SOLR
1.0000 g | Freq: Once | INTRAMUSCULAR | Status: AC
  Administered 2014-03-11: 1 g via INTRAMUSCULAR
  Filled 2014-03-11: qty 10

## 2014-03-11 MED ORDER — LIDOCAINE HCL 1 % IJ SOLN
INTRAMUSCULAR | Status: AC
  Administered 2014-03-11: 20 mL
  Filled 2014-03-11: qty 20

## 2014-03-11 NOTE — ED Provider Notes (Signed)
Medical screening examination/treatment/procedure(s) were performed by non-physician practitioner and as supervising physician I was immediately available for consultation/collaboration.   EKG Interpretation None        Lyanne CoKevin M Ski Polich, MD 03/11/14 [phone]

## 2014-03-11 NOTE — Discharge Instructions (Signed)
Do not hesitate to return to the emergency room for any new, worsening or concerning symptoms.  Please obtain primary care using resource guide below. But the minute you were seen in the emergency room and that they will need to obtain records for further outpatient management.   Dental Abscess A dental abscess is a collection of infected fluid (pus) from a bacterial infection in the inner part of the tooth (pulp). It usually occurs at the end of the tooth's root.  CAUSES   Severe tooth decay.  Trauma to the tooth that allows bacteria to enter into the pulp, such as a broken or chipped tooth. SYMPTOMS   Severe pain in and around the infected tooth.  Swelling and redness around the abscessed tooth or in the mouth or face.  Tenderness.  Pus drainage.  Bad breath.  Bitter taste in the mouth.  Difficulty swallowing.  Difficulty opening the mouth.  Nausea.  Vomiting.  Chills.  Swollen neck glands. DIAGNOSIS   A medical and dental history will be taken.  An examination will be performed by tapping on the abscessed tooth.  X-rays may be taken of the tooth to identify the abscess. TREATMENT The goal of treatment is to eliminate the infection. You may be prescribed antibiotic medicine to stop the infection from spreading. A root canal may be performed to save the tooth. If the tooth cannot be saved, it may be pulled (extracted) and the abscess may be drained.  HOME CARE INSTRUCTIONS  Only take over-the-counter or prescription medicines for pain, fever, or discomfort as directed by your caregiver.  Rinse your mouth (gargle) often with salt water ( tsp salt in 8 oz [250 ml] of warm water) to relieve pain or swelling.  Do not drive after taking pain medicine (narcotics).  Do not apply heat to the outside of your face.  Return to your dentist for further treatment as directed. SEEK MEDICAL CARE IF:  Your pain is not helped by medicine.  Your pain is getting worse  instead of better. SEEK IMMEDIATE MEDICAL CARE IF:  You have a fever or persistent symptoms for more than 2-3 days.  You have a fever and your symptoms suddenly get worse.  You have chills or a very bad headache.  You have problems breathing or swallowing.  You have trouble opening your mouth.  You have swelling in the neck or around the eye. Document Released: 05/12/2005 Document Revised: 02/04/2012 Document Reviewed: 08/20/2010 [facility] Patient Information 2015 [place], [place]. This information is not intended to replace advice given to you by your health care provider. Make sure you discuss any questions you have with your health care provider.   Emergency Department Resource Guide 1) Find a Doctor and Pay Out of Pocket Although you won't have to find out who is covered by your insurance plan, it is a good idea to ask around and get recommendations. You will then need to call the office and see if the doctor you have chosen will accept you as a new patient and what types of options they offer for patients who are self-pay. Some doctors offer discounts or will set up payment plans for their patients who do not have insurance, but you will need to ask so you aren't surprised when you get to your appointment.  2) Contact Your Local Health Department Not all health departments have doctors that can see patients for sick visits, but many do, so it is worth a call to see if yours does. If  you don't know where your local health department is, you can check in your phone book. The CDC also has a tool to help you locate your state's health department, and many state websites also have listings of all of their local health departments.  3) Find a Walk-in Clinic If your illness is not likely to be very severe or complicated, you may want to try a walk in clinic. These are popping up all over the country in pharmacies, drugstores, and shopping centers. They're usually staffed by nurse practitioners  or physician assistants that have been trained to treat common illnesses and complaints. They're usually fairly quick and inexpensive. However, if you have serious medical issues or chronic medical problems, these are probably not your best option.  No Primary Care Doctor: - Call Health Connect at  [phone] - they can help you locate a primary care doctor that  accepts your insurance, provides certain services, etc. - Physician Referral Service- [phone]  Chronic Pain Problems: Organization         Address  Phone   Notes  [facility]  [phone]) [phone] Patients need to be referred by their primary care doctor.   Medication Assistance: Organization         Address  Phone   Notes  [facility] Medication [facility] [address] [place], [place] [address] [phone] --Must be a resident of [facility] [place] -- Must have NO insurance coverage whatsoever (no Medicaid/ Medicare, etc.) -- The pt. MUST have a primary care doctor that directs their care regularly and follows them in the community   Med[facility]ist  [phone]   [employer]  [phone]    Agencies that provide inexpensive medical care: Organization         Address  Phone   Notes  [facility]  [phone]   [facility]    [phone]) [phone]   [facility] [address] [place], [place] [address] [phone]   [facility] [address], [place] [phone]   [facility]    [phone]) [phone]   [facility]    [phone]) [phone]   [facility]  [address], [place] Phone:  [phone], Fax:  [phone](336) [phone] Hours of Operation:  9 am - 6 pm, M-F.  Also accepts Medicaid/Medicare and self-pay.  [facility]  [address], [place] Phone: [phone]) [phone], Fax: [phone]. Hours of Operation:  8:30 am - 5:30 pm, M-F.   Also accepts Medicaid and self-pay.  [facility] [place] [address], [place] Phone: [phone]) [phone]   [facility] [address], [place], [place] [phone], Ext. [phone] Mondays & Thursdays: 7-9 AM.  First 15 patients are seen on a first come, first serve basis.    Medicaid-accepting [facility] Providers:  Organization         Address  Phone   Notes  [facility] [address], [place] [phone]) [phone] Also accepts self-pay patients.  [facility] [address], [place]  [phone]   [facility] [address], [place] [phone]   [facility] [address] [place] [address], [place] [phone]   Renaye RakersVeita Bland [address], [place]   [phone] Only accepts  [place] Access Medicaid patients after they have their name applied to their card.   Self-Pay (no insurance) in [facility] [place]:  Organization         Address  Phone   Notes  Sickle Cell Patients, [facility] [address], [place] [phone]) [phone]   [facility] [address], [place] [phone]   [facility]  [address], [place] [phone]) [phone]   [facility]/Dr. Osei-Bonsu  [address] [place] [address], [place] or [id number], [place] [phone] Phone number for both [place] and [place] locations is the same.  [facility] [address], [place] [phone]   [facility] [address] [place] [address], [place] or [address] [phone]   [facility] [address], [place] [phone], phone; [phone]) [phone], fax Sees patients 1st and 3rd Saturday of every month.  Must not qualify for public or private insurance (i.e. Medicaid, Medicare, [place] Health Choice, Veterans'  Benefits)  Household income should be no more than 200% of the poverty level The clinic cannot treat you if you are pregnant or think you are pregnant  Sexually transmitted diseases are not treated at the clinic.    Dental Care: Organization         Address  Phone  Notes  [facility] [address], [place] [phone] Accepts children up to age 47 who are enrolled in [place] or [place] Health Choice; pregnant women with a Medicaid card; and children who have applied for Medicaid or [place] Health Choice, but were declined, whose parents can pay a reduced fee at time of service.  [facility] of [facility] [place]  [address], [place] [phone] Accepts children up to age 47 who are enrolled in [place] or [place] Health Choice; pregnant women with a Medicaid card; and children who have applied for Medicaid or [place] Health Choice, but were declined, whose parents can pay a reduced fee at time of service.  [facility]  [address], [place] [phone] Patients are seen by appointment only. Walk-ins are not accepted. [facility] will see patients 47 years of age and older. Monday - Tuesday (8am-5pm) Most Wednesdays (8:30-5pm) $30 per visit, cash only  [facility]  [address], [place] [phone] Patients are seen by appointment only. Walk-ins are not accepted. [facility] will see patients 47 years of age and older. One Wednesday Evening (Monthly: Volunteer Based).  $30 per visit, cash only  [facility]  [phone] for adults; Children under age 154, call Graduate Pediatric Dentistry at [phone]. Children aged 504-14, please call [phone] to request a pediatric application.  Dental services are provided in all areas of dental care including fillings, crowns and bridges, complete and partial  dentures, implants, gum treatment, root canals, and extractions. Preventive care is also provided. Treatment is provided to both adults and children. Patients are selected via a lottery and there is often a waiting list.   [facility] [address], [place]  [phone] [email or web address]   [facility] [address], [place], [place] [phone], Ext. [phone] Second and Fourth Thursday of each month, opens at 6:30 AM; Clinic ends at  AM.  Patients are seen on a first-come first-served basis, and a limited number are seen during each clinic.  ° °[facility] ° [address], [place], [place] [phone]   Eligibility Requirements °You must have lived in [place], [place], or [place] counties for at least the last three months. °  You cannot be eligible for state or federal sponsored healthcare insurance, including Veterans Administration, Medicaid, or Medicare. °  You generally cannot be eligible for healthcare insurance through your employer.  °  How to apply: °Eligibility screenings are held every Tuesday and Wednesday afternoon from 1:00 pm until 4:00 pm. You do not need an appointment for the interview!  °[facility] [address], [place], [place] [phone]   °[facility]  336-342-8273   °[place]  336-703-3100   °[facility]  336-570-6415   ° °Behavioral Health Resources in the Community: °Intensive Outpatient Programs °Organization         Address  Phone  Notes  °[place] [facility] [address], [place], [place] [phone]   °[facility] [address], [place], [place] 336-832-9800   °[facility] [address], [place], [place] ° 336-882-2125   °[facility] [address],  °[place], [place] 1-800-853-5163 or 336-641-4981   °Substance Abuse Resources °Organization          Address  Phone  Notes  °[facility]  336-882-2125   °[facility]  336-784-9470   °[facility]  336-285-9073   °[facility]  336-845-3988   °Residential & Outpatient Substance Abuse Program  1-800-659-3381   °Psychological Services °Organization         Address  Phone  Notes  °[facility]  336- 832-9600   °[facility]  336- 378-7881   °[facility] [address], [place] 1-800-853-5163 or 336-641-4981   ° °Mobile Crisis Teams °Organization         Address  Phone  Notes  °[facility], Mobile Crisis Care Unit  1-877-626-1772   °Assertive °[facility] ° [address] [place], [place] 336-834-9664   °Sharon DeEsch [address] °[place] 336-554-5454   ° °Self-Help/Support Groups °Organization         Address  Phone             Notes  °[facility] [place] - variety of support groups  336- 373-1402 Call for more information  °Narcotics Anonymous (NA), [facility] [address], °[place]  2 meetings at this location  ° °Residential Treatment Programs °Organization         Address  Phone  Notes  °[facility] [address],    °[place]  1-866-801-8205   °[facility] ° [address], [place], [place] 704-293-8524   °[facility] [address], [place] 336-845-3988 Admissions: 8am-3pm M-F  °[facility] [address]    °[place], [place] 336-841-1104   °[facility] [address], [place], [place] 336-379-7146   °[facility] [address]  °[place], [place] 336-285-9073   °[facility] Intensive Outpatient [address], [place],  336-852-3033   °[facility] ([facility] [address]  °[place],  1-877-615-2722 or 336-784-9470   °[facility] ([facility])   [address] [place], [place]  336-227-7417 Accepts Medicaid  °[facility] [address]  °[place] 1-800-659-3381 Substance Abuse/Addiction Treatment  ° °[place] Behavioral Health Resources °Organization         Address  Phone  Notes  °[facility]  (888) 581-9988   °Julie Brannon, PhD [address] [place], [place]   (336) 349-5553 or (336) 951-0000   °[facility]   [address] °[place], [place] (336) 349-4454   °[facility] [address], [place], [place] (336) 342-8316 Insurance/Medicaid/sponsorship through [facility]  °[facility] [address]                                    [place], [place] (336) 342-8316 Therapy/tele-psych/case  °[facility] [address]  ° [place], [place] (336) 349-2233    °Dr. Arfeen  (336) 349-4544   °[facility] [place]  [employer] [place] [facility] 1) [address], [place] °2) [address], [place] °3)  [address], [place] (336) 349-3220 °(336) 342-7768 ° °(336) 342-8140   °[place] Child Abuse Hotline (336) 342-1394 or (336) 342-3537 (After Hours)    ° ° ° °

## 2014-03-11 NOTE — ED Provider Notes (Signed)
CSN: [id number]     Arrival date & time 03/11/14  0307 History   First MD Initiated Contact with Patient 03/11/14 0355     Chief Complaint  Patient presents with  . Dental Pain     (Consider location/radiation/quality/duration/timing/severity/associated sxs/prior Treatment) HPI  Kaitlyn Robertson is a 48 y.o. female complaining of dental pain which she's had for approximately week, she's been seen 3 times in the last 3 days. States that she is unable to fill her prescriptions because she doesn't have any money. States her mother can help her fill the prescriptions but that will be for 2 days. States that she now has blood in her mouth since this afternoon. She denies recent fever (states that she had a subjective fever 3 days ago) nausea vomiting, difficulty opening her mouth, difficulty swallowing her secretions.   Past Medical History  Diagnosis Date  . Anxiety   . Hypertension   . GERD (gastroesophageal reflux disease)    Past Surgical History  Procedure Laterality Date  . Cesarean section     History reviewed. No pertinent family history. History  Substance Use Topics  . Smoking status: Current Some Day Smoker -- 0.50 packs/day    Types: Cigarettes  . Smokeless tobacco: Never Used  . Alcohol Use: Yes     Comment: occasionally   OB History   Grav Para Term Preterm Abortions TAB SAB Ect Mult Living                 Review of Systems  10 systems reviewed and found to be negative, except as noted in the HPI.   Allergies  Citrus  Home Medications   Prior to Admission medications   Medication Sig Start Date End Date Taking? Authorizing Provider  benzocaine (ORAJEL) 10 % mucosal gel Use as directed 1 application in the mouth or throat as needed for mouth pain.    Historical Provider, MD  cholecalciferol (VITAMIN D) 1000 UNITS tablet Take 1,000 Units by mouth 2 (two) times daily.    Historical Provider, MD  ibuprofen (ADVIL,MOTRIN) 200 MG tablet Take 400 mg by mouth every  6 (six) hours as needed for moderate pain.    Historical Provider, MD  lisinopril (PRINIVIL,ZESTRIL) 20 MG tablet Take 20 mg by mouth daily.    Historical Provider, MD  penicillin v potassium (VEETID) 500 MG tablet Take 1 tablet (500 mg total) by mouth 4 (four) times daily. 03/07/14   Arman FilterGail K Schulz, NP  RANITIDINE HCL PO Take 75 mg by mouth 2 (two) times daily.     Historical Provider, MD  traMADol (ULTRAM) 50 MG tablet Take 1 tablet (50 mg total) by mouth every 6 (six) hours as needed. 03/07/14   Arman FilterGail K Schulz, NP   BP 127/74  Pulse 98  Temp(Src) 99.2 F (37.3 C) (Oral)  Resp 16  Ht 5\' 4"  (1.626 m)  Wt 256 lb (116.121 kg)  BMI 43.92 kg/m2  SpO2 97%  LMP 02/28/2014 Physical Exam  Nursing note and vitals reviewed. Constitutional: She is oriented to person, place, and time. She appears well-developed and well-nourished. No distress.  HENT:  Head: Normocephalic.  Generally poor dentition, no gingival swelling, erythema or tenderness to palpation. Patient is handling their secretions. There is no tenderness to palpation or firmness underneath tongue bilaterally. No trismus.    Eyes: Conjunctivae and EOM are normal.  Cardiovascular: Normal rate.   Pulmonary/Chest: Effort normal. No stridor.  Musculoskeletal: Normal range of motion.  Neurological: She is alert  and oriented to person, place, and time.  Skin:  Vitiligo  Psychiatric: She has a normal mood and affect.    ED Course  Procedures (including critical care time) Labs Review Labs Reviewed - No data to display  Imaging Review No results found.   EKG Interpretation None      MDM   Final diagnoses:  Dental abscess    Filed Vitals:   03/11/14 0327  BP: 127/74  Pulse: 98  Temp: 99.2 F (37.3 C)  TempSrc: Oral  Resp: 16  Height: 5\' 4"  (1.626 m)  Weight: 256 lb (116.121 kg)  SpO2: 97%    Medications  cefTRIAXone (ROCEPHIN) injection 1 g (not administered)  oxyCODONE-acetaminophen (PERCOCET/ROXICET) 5-325 MG  per tablet 1 tablet (not administered)    Kaitlyn Robertson is a 48 y.o. female presenting with dental pain and she states that she has actively draining dental abscess. On my exam there is no fluctuance, there is no active drainage, she is tender to palpation in the left maxillary area. Patient cannot afford her outpatient prescription. I've advised her were to obtain the cheapest medications. She will be given shot of Rocephin and Percocet in the ED.  Evaluation does not show pathology that would require ongoing emergent intervention or inpatient treatment. Pt is hemodynamically stable and mentating appropriately. Discussed findings and plan with patient/guardian, who agrees with care plan. All questions answered. Return precautions discussed and outpatient follow up given.      Wynetta Emeryicole Dorin [place], PA-C 03/11/14 [phone]

## 2014-03-11 NOTE — ED Notes (Signed)
Pt has dental pain in the upper left side tooth.  Pt seen here for the same.

## 2015-01-01 ENCOUNTER — Emergency Department (HOSPITAL_COMMUNITY)
Admission: EM | Admit: 2015-01-01 | Discharge: 2015-01-01 | Disposition: A | Discharge status: Home / Self Care | Payer: Self-pay | Attending: Emergency Medicine | Admitting: Emergency Medicine

## 2015-01-01 ENCOUNTER — Encounter (HOSPITAL_COMMUNITY): Payer: Self-pay | Admitting: Emergency Medicine

## 2015-01-01 DIAGNOSIS — Z79899 Other long term (current) drug therapy: Secondary | ICD-10-CM | POA: Insufficient documentation

## 2015-01-01 DIAGNOSIS — K219 Gastro-esophageal reflux disease without esophagitis: Secondary | ICD-10-CM | POA: Insufficient documentation

## 2015-01-01 DIAGNOSIS — Z720 Tobacco use: Secondary | ICD-10-CM | POA: Insufficient documentation

## 2015-01-01 DIAGNOSIS — M5441 Lumbago with sciatica, right side: Secondary | ICD-10-CM | POA: Insufficient documentation

## 2015-01-01 DIAGNOSIS — I10 Essential (primary) hypertension: Secondary | ICD-10-CM | POA: Insufficient documentation

## 2015-01-01 DIAGNOSIS — F419 Anxiety disorder, unspecified: Secondary | ICD-10-CM | POA: Insufficient documentation

## 2015-01-01 DIAGNOSIS — Z792 Long term (current) use of antibiotics: Secondary | ICD-10-CM | POA: Insufficient documentation

## 2015-01-01 MED ORDER — METHOCARBAMOL 500 MG PO TABS: 500.0000 mg | ORAL_TABLET | Freq: Two times a day (BID) | ORAL | Status: DC

## 2015-01-01 MED ORDER — HYDROCODONE-ACETAMINOPHEN 5-325 MG PO TABS
2.0000 | ORAL_TABLET | Freq: Once | ORAL | Status: AC
  Administered 2015-01-01: 2 via ORAL
  Filled 2015-01-01: qty 2

## 2015-01-01 MED ORDER — NAPROXEN 500 MG PO TABS: 500.0000 mg | ORAL_TABLET | Freq: Two times a day (BID) | ORAL | Status: DC

## 2015-01-01 MED ORDER — TRAMADOL HCL 50 MG PO TABS
50.0000 mg | ORAL_TABLET | Freq: Four times a day (QID) | ORAL | Status: DC | PRN
Start: 2015-01-01 — End: 2021-01-18

## 2015-01-01 MED ORDER — METHOCARBAMOL 500 MG PO TABS
500.0000 mg | ORAL_TABLET | Freq: Once | ORAL | Status: AC
  Administered 2015-01-01: 500 mg via ORAL
  Filled 2015-01-01: qty 1

## 2015-01-01 NOTE — ED Provider Notes (Signed)
CSN: [id number]     Arrival date & time 01/01/15  1650 History  This chart was scribed for Santiago Glad, PA-C, working with Blake Divine, MD by Chestine Spore, ED Scribe. The patient was seen in room WTR6/WTR6 at 5:51 PM.     Chief Complaint  Patient presents with  . Back Pain    R side, shooting in RLE x6 days      The history is provided by the patient. No language interpreter was used.    HPI Comments: Kaitlyn Robertson is a 49 y.o. female with a medical hx of HTN who presents to the Emergency Department complaining of right sided low back pain onset 6 days. Pt denies any injury to her back but she got out of bed and she felt a pull sensation that resolved. Pt began to have her back pain the following day. Pt states that her current pain is not as bad as the pain she has had in the past.  She reports that the back pain does radiate down the back of her right leg. She states that she is having associated symptoms of numbness to right leg and tingling to right leg intermittently. She states that she has tried tylenol with no relief for her symptoms. Pt denies bowel/bladder incontinence, fever, chills, and any other symptoms. Pt denies hx of CA or IV drug use. Pt goes to [facility] for her PCP.    Past Medical History  Diagnosis Date  . Anxiety   . Hypertension   . GERD (gastroesophageal reflux disease)    Past Surgical History  Procedure Laterality Date  . Cesarean section     No family history on file. History  Substance Use Topics  . Smoking status: Current Some Day Smoker -- 0.50 packs/day    Types: Cigarettes  . Smokeless tobacco: Never Used  . Alcohol Use: Yes     Comment: occasionally   OB History    No data available     Review of Systems  Musculoskeletal: Positive for back pain and arthralgias.  Skin: Negative for color change, rash and wound.  Neurological: Positive for numbness. Negative for weakness.       Tingling to back of right leg      Allergies   Citrus  Home Medications   Prior to Admission medications   Medication Sig Start Date End Date Taking? Authorizing Provider  benzocaine (ORAJEL) 10 % mucosal gel Use as directed 1 application in the mouth or throat as needed for mouth pain.    Historical Provider, MD  cholecalciferol (VITAMIN D) 1000 UNITS tablet Take 1,000 Units by mouth 2 (two) times daily.    Historical Provider, MD  ibuprofen (ADVIL,MOTRIN) 200 MG tablet Take 400 mg by mouth every 6 (six) hours as needed for moderate pain.    Historical Provider, MD  lisinopril (PRINIVIL,ZESTRIL) 20 MG tablet Take 20 mg by mouth daily.    Historical Provider, MD  penicillin v potassium (VEETID) 500 MG tablet Take 1 tablet (500 mg total) by mouth 4 (four) times daily. 03/07/14   Earley Favor, NP  RANITIDINE HCL PO Take 75 mg by mouth 2 (two) times daily.     Historical Provider, MD  traMADol (ULTRAM) 50 MG tablet Take 1 tablet (50 mg total) by mouth every 6 (six) hours as needed. 03/07/14   Earley Favor, NP   BP 160/105 mmHg  Pulse 93  Temp(Src) 98.7 F (37.1 C) (Oral)  Resp 18  Ht 5\' 3"  (1.6  m)  Wt 246 lb (111.585 kg)  BMI 43.59 kg/m2  SpO2 96%  LMP 12/25/2014 Physical Exam  Constitutional: She is oriented to person, place, and time. She appears well-developed and well-nourished. No distress.  HENT:  Head: Normocephalic and atraumatic.  Eyes: EOM are normal.  Neck: Neck supple. No tracheal deviation present.  Cardiovascular: Normal rate and regular rhythm.   Pulses:      Dorsalis pedis pulses are 2+ on the right side, and 2+ on the left side.  Pulmonary/Chest: Effort normal and breath sounds normal. No respiratory distress.  Musculoskeletal: Normal range of motion.       Lumbar back: She exhibits tenderness. She exhibits no edema.  TTP of the lumbar spine with no overlying erythema, edema, or warmth.  Neurological: She is alert and oriented to person, place, and time. She has normal strength. No sensory deficit.  Reflex  Scores:      Patellar reflexes are 2+ on the right side and 2+ on the left side. Distal sensation of bilateral feet intact.  Skin: Skin is warm and dry.  Psychiatric: She has a normal mood and affect. Her behavior is normal.  Nursing note and vitals reviewed.   ED Course  Procedures (including critical care time) DIAGNOSTIC STUDIES: Oxygen Saturation is 96% on RA, nl by my interpretation.    COORDINATION OF CARE: 5:45 PM Discussed treatment plan with pt at bedside and pt agreed to plan.   Labs Review Labs Reviewed - No data to display  Imaging Review No results found.   EKG Interpretation None      MDM   Final diagnoses:  None   Patient with back pain.  No neurological deficits and normal neuro exam.  Patient can walk but states is painful.  No loss of bowel or bladder control.  No concern for cauda equina.  No fever, night sweats, weight loss, h/o cancer, IVDU.  RICE protocol and pain medicine indicated and discussed with patient.  Patient stable for discharge.  Return precautions given.  I personally performed the services described in this documentation, which was scribed in my presence. The recorded information has been reviewed and is accurate.    Santiago Glad, PA-C 01/01/15 2223  Blake Divine, MD 01/04/15 [phone]

## 2015-01-01 NOTE — ED Notes (Addendum)
Pt A+Ox4, reports c/o R sided low back pain shooting into RLE.  Pt denies injury.  No change with OTC medications.  Pt standing for comfort.  Ambulatory with steady gait.  MAEI.  Skin PWD.  Speaking full/clear sentences, rr even/un-lab.  NAD.

## 2015-01-05 ENCOUNTER — Emergency Department (HOSPITAL_COMMUNITY)
Admission: EM | Admit: 2015-01-05 | Discharge: 2015-01-05 | Disposition: A | Discharge status: Home / Self Care | Payer: Self-pay | Attending: Emergency Medicine | Admitting: Emergency Medicine

## 2015-01-05 ENCOUNTER — Encounter (HOSPITAL_COMMUNITY): Payer: Self-pay | Admitting: Emergency Medicine

## 2015-01-05 DIAGNOSIS — R202 Paresthesia of skin: Secondary | ICD-10-CM

## 2015-01-05 DIAGNOSIS — K219 Gastro-esophageal reflux disease without esophagitis: Secondary | ICD-10-CM | POA: Insufficient documentation

## 2015-01-05 DIAGNOSIS — Z791 Long term (current) use of non-steroidal anti-inflammatories (NSAID): Secondary | ICD-10-CM | POA: Insufficient documentation

## 2015-01-05 DIAGNOSIS — M62830 Muscle spasm of back: Secondary | ICD-10-CM

## 2015-01-05 DIAGNOSIS — Z79899 Other long term (current) drug therapy: Secondary | ICD-10-CM | POA: Insufficient documentation

## 2015-01-05 DIAGNOSIS — Z720 Tobacco use: Secondary | ICD-10-CM | POA: Insufficient documentation

## 2015-01-05 DIAGNOSIS — F419 Anxiety disorder, unspecified: Secondary | ICD-10-CM | POA: Insufficient documentation

## 2015-01-05 DIAGNOSIS — I10 Essential (primary) hypertension: Secondary | ICD-10-CM | POA: Insufficient documentation

## 2015-01-05 DIAGNOSIS — Z792 Long term (current) use of antibiotics: Secondary | ICD-10-CM | POA: Insufficient documentation

## 2015-01-05 DIAGNOSIS — M5441 Lumbago with sciatica, right side: Secondary | ICD-10-CM | POA: Insufficient documentation

## 2015-01-05 MED ORDER — TRAMADOL HCL 50 MG PO TABS
50.0000 mg | ORAL_TABLET | Freq: Once | ORAL | Status: AC
  Administered 2015-01-05: 50 mg via ORAL
  Filled 2015-01-05: qty 1

## 2015-01-05 MED ORDER — PREDNISONE 20 MG PO TABS: ORAL_TABLET | ORAL | Status: DC

## 2015-01-05 NOTE — ED Provider Notes (Signed)
CSN: [id number]     Arrival date & time 01/05/15  1638 History  This chart was scribed for [employer], PA-C, working with Richardean Canal, MD by Octavia Heir, ED Scribe. This patient was seen in room WTR6/WTR6 and the patient's care was started at 5:32 PM.    Chief Complaint  Patient presents with  . Foot Problem    r/foot -numbness on portion of foot      Patient is a 49 y.o. female presenting with back pain. The history is provided by the patient. No language interpreter was used.  Back Pain Location:  Lumbar spine Quality:  Aching and shooting Radiates to:  R foot and R posterior upper leg Pain severity:  Moderate Pain is:  Same all the time Onset quality:  Gradual Duration:  1 week Timing:  Constant Progression:  Unchanged Chronicity:  Recurrent Context: physical stress   Relieved by:  Nothing Worsened by:  Lying down and movement Ineffective treatments:  Muscle relaxants and NSAIDs Associated symptoms: leg pain, paresthesias and tingling   Associated symptoms: no abdominal pain, no bladder incontinence, no bowel incontinence, no chest pain, no dysuria, no fever, no headaches, no numbness and no weakness   Risk factors: no hx of cancer    HPI Comments: Kaitlyn Robertson is a 49 y.o. female with a PMHx of HTN, who presents to the Emergency Department complaining of constant right sided back pain onset one week ago. She rates her pain 8/10 and states she has an aching and shooting pain on the right side of her lower back that radiates down her right leg and into her right foot. Started after she was getting up from a bed and felt a pulling sensation. She reports waking up this morning and her right pinky toe and right 4th toe were tingling, with some tingling in the bottom of parts of her R foot. Tingling somewhat extends from low back to R posterior thigh/leg. Pt states sitting/laying down/movement make her pain worse. She notes taking robaxin and naprosyn for pain but states  they did not alleviate the pain. Has not tried tramadol that was prescribed to her on 01/01/15 when she was evaluated for the same pain. States this back pain is somewhat less severe than her chronic back pain. Pt denies fevers, chills, chest pain, shortness of breath, abd pain, nausea, vomiting, diarrhea, dysuria, hematuria, headaches, visual changes, numbness, weakness, hx of CA or IVDU. No cauda equina symptoms or incontinence.  Past Medical History  Diagnosis Date  . Anxiety   . Hypertension   . GERD (gastroesophageal reflux disease)    Past Surgical History  Procedure Laterality Date  . Cesarean section     Family History  Problem Relation Age of Onset  . Hypertension Mother   . Hypertension Father    Social History  Substance Use Topics  . Smoking status: Current Some Day Smoker -- 0.50 packs/day    Types: Cigarettes  . Smokeless tobacco: Never Used  . Alcohol Use: Yes     Comment: occasionally   OB History    No data available     Review of Systems  Constitutional: Negative for fever and chills.  Eyes: Negative for visual disturbance.  Respiratory: Negative for shortness of breath.   Cardiovascular: Negative for chest pain.  Gastrointestinal: Negative for nausea, vomiting, abdominal pain, diarrhea and bowel incontinence.  Genitourinary: Negative for bladder incontinence, dysuria, hematuria and flank pain.  Musculoskeletal: Positive for back pain.  Skin: Negative for color  change.  Allergic/Immunologic: Negative for immunocompromised state.  Neurological: Positive for tingling and paresthesias. Negative for weakness, numbness and headaches.       +tingling in R foot/leg  10 Systems reviewed and are negative for acute change except as noted in the HPI.     Allergies  Citrus  Home Medications   Prior to Admission medications   Medication Sig Start Date End Date Taking? Authorizing Provider  benzocaine (ORAJEL) 10 % mucosal gel Use as directed 1 application in the  mouth or throat as needed for mouth pain.    Historical Provider, MD  cholecalciferol (VITAMIN D) 1000 UNITS tablet Take 1,000 Units by mouth 2 (two) times daily.    Historical Provider, MD  ibuprofen (ADVIL,MOTRIN) 200 MG tablet Take 400 mg by mouth every 6 (six) hours as needed for moderate pain.    Historical Provider, MD  lisinopril (PRINIVIL,ZESTRIL) 20 MG tablet Take 20 mg by mouth daily.    Historical Provider, MD  methocarbamol (ROBAXIN) 500 MG tablet Take 1 tablet (500 mg total) by mouth 2 (two) times daily. 01/01/15   Heather Laisure, PA-C  naproxen (NAPROSYN) 500 MG tablet Take 1 tablet (500 mg total) by mouth 2 (two) times daily. 01/01/15   Heather Laisure, PA-C  penicillin v potassium (VEETID) 500 MG tablet Take 1 tablet (500 mg total) by mouth 4 (four) times daily. 03/07/14   Earley Favor, NP  RANITIDINE HCL PO Take 75 mg by mouth 2 (two) times daily.     Historical Provider, MD  traMADol (ULTRAM) 50 MG tablet Take 1 tablet (50 mg total) by mouth every 6 (six) hours as needed. 01/01/15   Santiago Glad, PA-C   Triage vitals: BP 146/94 mmHg  Pulse 94  Temp(Src) 97.6 F (36.4 C) (Oral)  Resp 20  Wt 235 lb (106.595 kg)  SpO2 98%  LMP 12/25/2014 Physical Exam  Constitutional: She is oriented to person, place, and time. Vital signs are normal. She appears well-developed and well-nourished.  Non-toxic appearance. No distress.  Afebrile, nontoxic, NAD  HENT:  Head: Normocephalic and atraumatic.  Mouth/Throat: Mucous membranes are normal.  Eyes: Conjunctivae and EOM are normal. Right eye exhibits no discharge. Left eye exhibits no discharge.  Neck: Normal range of motion. Neck supple.  Cardiovascular: Normal rate and intact distal pulses.   Pulmonary/Chest: Effort normal. No respiratory distress.  Abdominal: Normal appearance. She exhibits no distension.  Musculoskeletal: Normal range of motion.       Lumbar back: She exhibits tenderness and spasm. She exhibits normal range of motion  and no deformity.       Back:  Lumbar spine with FROM intact without spinous process TTP, no bony stepoffs or deformities, mild diffuse R sided paraspinous muscle TTP and muscle spasms. Strength 5/5 in all extremities, sensation grossly intact in all extremities, +SLR in R leg, neg SLR of L leg, gait steady and nonantalgic. No overlying skin changes.   Neurological: She is alert and oriented to person, place, and time. She has normal strength. No sensory deficit. Gait normal.  Skin: Skin is warm, dry and intact. No rash noted.  Psychiatric: She has a normal mood and affect. Her behavior is normal.  Nursing note and vitals reviewed.   ED Course  Procedures  DIAGNOSTIC STUDIES: Oxygen Saturation is 98% on RA, normal by my interpretation.  COORDINATION OF CARE:  5:38 PM Discussed treatment plan which includes prednisone and tramadol with pt at bedside and pt agreed to plan.  Labs Review Labs  Reviewed - No data to display  Imaging Review No results found.  Lumbar x-ray on 07/28/2008: LUMBAR SPINE - COMPLETE 4+ VIEW  Comparison: 07/16/2004  Findings: Normal alignment. Negative for compression fracture or focal kyphosis. Mild diffuse lower thoracic and lumbar degenerative changes and spondylosis. No pars defects. Intact pedicles.  IMPRESSION: Mild degenerative changes and spondylosis. No acute finding.  Provider: Zonia Kief, Brandy McCrickard, Juanita Souther  I, Camprubi-Soms, Ben Arnold, personally reviewed and evaluated these images and lab results as part of my medical decision-making.   EKG Interpretation None      MDM   Final diagnoses:  Right-sided low back pain with right-sided sciatica  Paresthesia of right foot  Spasm of back muscles    49 y.o. female here with ongoing R low back pain and tingling in R foot/leg in dermatomal distribution of L4/5. Seen on 8/8 for same, diagnosed with sciatica. Has been taking robaxin and naprosyn without  relief, didn't try tramadol yet. On exam, mild diffuse tenderness to lumbar spine on R side, with +SLR. No red flag s/s of low back pain. No s/s of central cord compression or cauda equina. Lower extremities are neurovascularly intact and patient is ambulating without difficulty. Describes tingling to pinky/4th digit and plantar foot, but sensation is intact. Doubt need for imaging, had xray 56yrs ago that showed degenerative changes in lumbar spine. Could potentially need nonemergent MRI but given no red flag s/sx today, doubt need for this now. Pt has robaxin, naprosyn, and tramadol at home. Will give tramadol here and advised her to keep taking other meds. Will add on prednisone to help with sciatica. Will refer to ortho for ongoing eval.  Patient was counseled on back pain precautions and told to do activity as tolerated but do not lift, push, or pull heavy objects more than 10 pounds for the next week. Patient counseled to use ice or heat on back for no longer than 15 minutes every hour.  Urged to return with worsening severe pain, loss of bowel or bladder control, trouble walking. The patient verbalizes understanding and agrees with the plan.   I personally performed the services described in this documentation, which was scribed in my presence. The recorded information has been reviewed and is accurate.  BP 146/94 mmHg  Pulse 94  Temp(Src) 97.6 F (36.4 C) (Oral)  Resp 20  Wt 235 lb (106.595 kg)  SpO2 98%  LMP 12/25/2014  Meds ordered this encounter  Medications  . traMADol (ULTRAM) tablet 50 mg    Sig:   . predniSONE (DELTASONE) 20 MG tablet    Sig: 3 tabs po daily x 4 days    Dispense:  12 tablet    Refill:  0    Order Specific Question:  Supervising Provider    Answer:  Angus Seller Camprubi-Soms, PA-C 01/05/15 1805  Richardean Canal, MD 01/05/15 [phone]

## 2015-01-05 NOTE — Discharge Instructions (Signed)
Your back pain is likely causing some nerve pain which is causing the tingling you are experiencing in your foot/leg. This is called sciatica. You may eventually need to have further imaging done on your back, therefore you will need to follow up with the orthopedist in 1-2 weeks for follow up and ongoing management of your back pain. Take prednisone as directed to help with the nerve pain, as well as the medications you were given at your last ER visit to help with pain. Return to the ER for changes or worsening symptoms.  Back Pain:  Your back pain should be treated with medicines such as ibuprofen or aleve and this back pain should get better over the next 2 weeks.  However if you develop severe or worsening pain, low back pain with fever, numbness, weakness or inability to walk or urinate, you should return to the ER immediately.  Please follow up with your doctor this week for a recheck if still having symptoms.  Low back pain is discomfort in the lower back that may be due to injuries to muscles and ligaments around the spine.  Occasionally, it may be caused by a a problem to a part of the spine called a disc.  The pain may last several days or a week;  However, most patients get completely well in 4 weeks.  Self - care:  The application of heat can help soothe the pain.  Maintaining your daily activities, including walking, is encourged, as it will help you get better faster than just staying in bed. Perform gentle stretching as discussed. Drink plenty of fluids.  Medications are also useful to help with pain control.  A commonly prescribed medication includes tramadol.  Do not drive or operate heavy machinery while taking this medication.  Non steroidal anti inflammatory medications including Ibuprofen and naproxen;  These medications help both pain and swelling and are very useful in treating back pain.  They should be taken with food, as they can cause stomach upset, and more seriously, stomach  bleeding.    Muscle relaxants:  These medications can help with muscle tightness that is a cause of lower back pain.  Most of these medications can cause drowsiness, and it is not safe to drive or use dangerous machinery while taking them.  SEEK IMMEDIATE MEDICAL ATTENTION IF: New numbness, tingling, weakness, or problem with the use of your arms or legs.  Severe back pain not relieved with medications.  Difficulty with or loss of control of your bowel or bladder control.  Increasing pain in any areas of the body (such as chest or abdominal pain).  Shortness of breath, dizziness or fainting.  Nausea (feeling sick to your stomach), vomiting, fever, or sweats.    Back Pain, Adult Back pain is very common. The pain often gets better over time. The cause of back pain is usually not dangerous. Most people can learn to manage their back pain on their own.  HOME CARE   Stay active. Start with short walks on flat ground if you can. Try to walk farther each day.  Do not sit, drive, or stand in one place for more than 30 minutes. Do not stay in bed.  Do not avoid exercise or work. Activity can help your back heal faster.  Be careful when you bend or lift an object. Bend at your knees, keep the object close to you, and do not twist.  Sleep on a firm mattress. Lie on your side, and bend your  knees. If you lie on your back, put a pillow under your knees.  Only take medicines as told by your doctor.  Put ice on the injured area.  Put ice in a plastic bag.  Place a towel between your skin and the bag.  Leave the ice on for 15-20 minutes, 03-04 times a day for the first 2 to 3 days. After that, you can switch between ice and heat packs.  Ask your doctor about back exercises or massage.  Avoid feeling anxious or stressed. Find good ways to deal with stress, such as exercise. GET HELP RIGHT AWAY IF:   Your pain does not go away with rest or medicine.  Your pain does not go away in 1  week.  You have new problems.  You do not feel well.  The pain spreads into your legs.  You cannot control when you poop (bowel movement) or pee (urinate).  Your arms or legs feel weak or lose feeling (numbness).  You feel sick to your stomach (nauseous) or throw up (vomit).  You have belly (abdominal) pain.  You feel like you may pass out (faint). MAKE SURE YOU:   Understand these instructions.  Will watch your condition.  Will get help right away if you are not doing well or get worse. Document Released: 10/29/2007 Document Revised: 08/04/2011 Document Reviewed: 09/13/2013 [facility] Patient Information 2015 [place], [place]. This information is not intended to replace advice given to you by your health care provider. Make sure you discuss any questions you have with your health care provider.  Back Exercises Back exercises help treat and prevent back injuries. The goal is to increase your strength in your belly (abdominal) and back muscles. These exercises can also help with flexibility. Start these exercises when told by your doctor. HOME CARE Back exercises include: Pelvic Tilt.  Lie on your back with your knees bent. Tilt your pelvis until the lower part of your back is against the floor. Hold this position 5 to 10 sec. Repeat this exercise 5 to 10 times. Knee to Chest.  Pull 1 knee up against your chest and hold for 20 to 30 seconds. Repeat this with the other knee. This may be done with the other leg straight or bent, whichever feels better. Then, pull both knees up against your chest. Sit-Ups or Curl-Ups.  Bend your knees 90 degrees. Start with tilting your pelvis, and do a partial, slow sit-up. Only lift your upper half 30 to 45 degrees off the floor. Take at least 2 to 3 seonds for each sit-up. Do not do sit-ups with your knees out straight. If partial sit-ups are difficult, simply do the above but with only tightening your belly (abdominal) muscles and holding it as  told. Hip-Lift.  Lie on your back with your knees flexed 90 degrees. Push down with your feet and shoulders as you raise your hips 2 inches off the floor. Hold for 10 seconds, repeat 5 to 10 times. Back Arches.  Lie on your stomach. Prop yourself up on bent elbows. Slowly press on your hands, causing an arch in your low back. Repeat 3 to 5 times. Shoulder-Lifts.  Lie face down with arms beside your body. Keep hips and belly pressed to floor as you slowly lift your head and shoulders off the floor. Do not overdo your exercises. Be careful in the beginning. Exercises may cause you some mild back discomfort. If the pain lasts for more than 15 minutes, stop the exercises until you  see your doctor. Improvement with exercise for back problems is slow.  Document Released: 06/14/2010 Document Revised: 08/04/2011 Document Reviewed: 03/13/2011 [facility] Patient Information 2015 [place], [place]. This information is not intended to replace advice given to you by your health care provider. Make sure you discuss any questions you have with your health care provider.  Muscle Cramps and Spasms Muscle cramps and spasms occur when a muscle or muscles tighten and you have no control over this tightening (involuntary muscle contraction). They are a common problem and can develop in any muscle. The most common place is in the calf muscles of the leg. Both muscle cramps and muscle spasms are involuntary muscle contractions, but they also have differences:   Muscle cramps are sporadic and painful. They may last a few seconds to a quarter of an hour. Muscle cramps are often more forceful and last longer than muscle spasms.  Muscle spasms may or may not be painful. They may also last just a few seconds or much longer. CAUSES  It is uncommon for cramps or spasms to be due to a serious underlying problem. In many cases, the cause of cramps or spasms is unknown. Some common causes are:   Overexertion.   Overuse from  repetitive motions (doing the same thing over and over).   Remaining in a certain position for a long period of time.   Improper preparation, form, or technique while performing a sport or activity.   Dehydration.   Injury.   Side effects of some medicines.   Abnormally low levels of the salts and ions in your blood (electrolytes), especially potassium and calcium. This could happen if you are taking water pills (diuretics) or you are pregnant.  Some underlying medical problems can make it more likely to develop cramps or spasms. These include, but are not limited to:   Diabetes.   Parkinson disease.   Hormone disorders, such as thyroid problems.   Alcohol abuse.   Diseases specific to muscles, joints, and bones.   Blood vessel disease where not enough blood is getting to the muscles.  HOME CARE INSTRUCTIONS   Stay well hydrated. Drink enough water and fluids to keep your urine clear or pale yellow.  It may be helpful to massage, stretch, and relax the affected muscle.  For tight or tense muscles, use a warm towel, heating pad, or hot shower water directed to the affected area.  If you are sore or have pain after a cramp or spasm, applying ice to the affected area may relieve discomfort.  Put ice in a plastic bag.  Place a towel between your skin and the bag.  Leave the ice on for 15-20 minutes, 03-04 times a day.  Medicines used to treat a known cause of cramps or spasms may help reduce their frequency or severity. Only take over-the-counter or prescription medicines as directed by your caregiver. SEEK MEDICAL CARE IF:  Your cramps or spasms get more severe, more frequent, or do not improve over time.  MAKE SURE YOU:   Understand these instructions.  Will watch your condition.  Will get help right away if you are not doing well or get worse. Document Released: 11/01/2001 Document Revised: 09/06/2012 Document Reviewed: 04/28/2012 [facility] Patient  Information 2015 [place], [place]. This information is not intended to replace advice given to you by your health care provider. Make sure you discuss any questions you have with your health care provider.  Sciatica Sciatica is pain, weakness, numbness, or tingling along your sciatic  nerve. The nerve starts in the lower back and runs down the back of each leg. Nerve damage or certain conditions pinch or put pressure on the sciatic nerve. This causes the pain, weakness, and other discomforts of sciatica. HOME CARE   Only take medicine as told by your doctor.  Apply ice to the affected area for 20 minutes. Do this 3-4 times a day for the first 48-72 hours. Then try heat in the same way.  Exercise, stretch, or do your usual activities if these do not make your pain worse.  Go to physical therapy as told by your doctor.  Keep all doctor visits as told.  Do not wear high heels or shoes that are not supportive.  Get a firm mattress if your mattress is too soft to lessen pain and discomfort. GET HELP RIGHT AWAY IF:   You cannot control when you poop (bowel movement) or pee (urinate).  You have more weakness in your lower back, lower belly (pelvis), butt (buttocks), or legs.  You have redness or puffiness (swelling) of your back.  You have a burning feeling when you pee.  You have pain that gets worse when you lie down.  You have pain that wakes you from your sleep.  Your pain is worse than past pain.  Your pain lasts longer than 4 weeks.  You are suddenly losing weight without reason. MAKE SURE YOU:   Understand these instructions.  Will watch this condition.  Will get help right away if you are not doing well or get worse. Document Released: 02/19/2008 Document Revised: 11/11/2011 Document Reviewed: 09/21/2011 [facility] Patient Information 2015 [place], [place]. This information is not intended to replace advice given to you by your health care provider. Make sure you discuss any  questions you have with your health care provider.  Heat Therapy Heat therapy can help make painful, stiff muscles and joints feel better. Do not use heat on new injuries. Wait at least 48 hours after an injury to use heat. Do not use heat when you have aches or pains right after an activity. If you still have pain 3 hours after stopping the activity, then you may use heat. HOME CARE Wet heat pack  Soak a clean towel in warm water. Squeeze out the extra water.  Put the warm, wet towel in a plastic bag.  Place a thin, dry towel between your skin and the bag.  Put the heat pack on the area for 5 minutes, and check your skin. Your skin may be pink, but it should not be red.  Leave the heat pack on the area for 15 to 30 minutes.  Repeat this every 2 to 4 hours while awake. Do not use heat while you are sleeping. Warm water bath  Fill a tub with warm water.  Place the affected body part in the tub.  Soak the area for 20 to 40 minutes.  Repeat as needed. Hot water bottle  Fill the water bottle half full with hot water.  Press out the extra air. Close the cap tightly.  Place a dry towel between your skin and the bottle.  Put the bottle on the area for 5 minutes, and check your skin. Your skin may be pink, but it should not be red.  Leave the bottle on the area for 15 to 30 minutes.  Repeat this every 2 to 4 hours while awake. Electric heating pad  Place a dry towel between your skin and the heating pad.  Set the heating pad on low heat.  Put the heating pad on the area for 10 minutes, and check your skin. Your skin may be pink, but it should not be red.  Leave the heating pad on the area for 20 to 40 minutes.  Repeat this every 2 to 4 hours while awake.  Do not lie on the heating pad.  Do not fall asleep while using the heating pad.  Do not use the heating pad near water. GET HELP RIGHT AWAY IF:  You get blisters or red skin.  Your skin is puffy (swollen), or you  lose feeling (numbness) in the affected area.  You have any new problems.  Your problems are getting worse.  You have any questions or concerns. If you have any problems, stop using heat therapy until you see your doctor. MAKE SURE YOU:  Understand these instructions.  Will watch your condition.  Will get help right away if you are not doing well or get worse. Document Released: 08/04/2011 Document Reviewed: 07/05/2013 [facility] Patient Information 2015 [place], [place]. This information is not intended to replace advice given to you by your health care provider. Make sure you discuss any questions you have with your health care provider.

## 2015-01-05 NOTE — ED Notes (Signed)
Pt reports numbness in r/foot since this am  ,Numbness  mainly in r/4th and 5th toe and part of foot. Pt is ambulatory without assist

## 2015-05-15 ENCOUNTER — Telehealth (HOSPITAL_COMMUNITY): Payer: Self-pay | Admitting: *Deleted

## 2015-05-15 NOTE — Telephone Encounter (Signed)
Telephoned patient at home # and left message to return call to [facility] 

## 2016-11-19 ENCOUNTER — Other Ambulatory Visit: Payer: Self-pay | Admitting: Obstetrics and Gynecology

## 2016-11-19 DIAGNOSIS — Z1231 Encounter for screening mammogram for malignant neoplasm of breast: Secondary | ICD-10-CM

## 2016-12-11 ENCOUNTER — Ambulatory Visit (HOSPITAL_COMMUNITY): Payer: Self-pay

## 2017-03-21 ENCOUNTER — Emergency Department (HOSPITAL_COMMUNITY): Payer: Self-pay

## 2017-03-21 ENCOUNTER — Emergency Department (HOSPITAL_COMMUNITY)
Admission: EM | Admit: 2017-03-21 | Discharge: 2017-03-21 | Disposition: A | Discharge status: Home / Self Care | Payer: Self-pay | Attending: Emergency Medicine | Admitting: Emergency Medicine

## 2017-03-21 ENCOUNTER — Encounter (HOSPITAL_COMMUNITY): Payer: Self-pay | Admitting: Emergency Medicine

## 2017-03-21 DIAGNOSIS — I10 Essential (primary) hypertension: Secondary | ICD-10-CM | POA: Insufficient documentation

## 2017-03-21 DIAGNOSIS — F17210 Nicotine dependence, cigarettes, uncomplicated: Secondary | ICD-10-CM | POA: Insufficient documentation

## 2017-03-21 DIAGNOSIS — R21 Rash and other nonspecific skin eruption: Secondary | ICD-10-CM | POA: Insufficient documentation

## 2017-03-21 DIAGNOSIS — Z79899 Other long term (current) drug therapy: Secondary | ICD-10-CM | POA: Insufficient documentation

## 2017-03-21 DIAGNOSIS — R05 Cough: Secondary | ICD-10-CM | POA: Insufficient documentation

## 2017-03-21 NOTE — Discharge Instructions (Signed)
Follow up at health department for further evaluation. Return to ED for worsening cough, night sweats, chest pain, coughing up blood, lip swelling, trouble breathing, drainage from site.

## 2017-03-21 NOTE — ED Triage Notes (Addendum)
Pt reports here for possible allergic reaction to PPD test placed Tuesday. Pt reports itching and pain to right forearm onset Thursday after getting site checked. Pt denies cough, shortness of breath or night sweats

## 2017-03-21 NOTE — ED Provider Notes (Signed)
[facility] EMERGENCY DEPARTMENT Provider Note   CSN: [id number] Arrival date & time: 03/21/17  1020     History   Chief Complaint Chief Complaint  Patient presents with  . Allergic Reaction    HPI Kaitlyn Robertson is a 51 y.o. female with past medical history of hypertension, who presents to ED for evaluation of possible allergic reaction to tB skin test placed approximately 4 days ago. She states that she does have a history of localized reactions to injections in the past. She had heard skin test read approximately 2 days ago and was told that it was negative. However she does have redness at the site. She states that she has been recovering from a URI for the past week and reports associated dry cough. She denies any swelling at site,drainge from site, fevers, hemoptysis, chest pain, shortness of breath, night sweats, weight loss, TB exposure, homelessness, travel out of the country, prior BCG vaccine, working in health care setting, history of malignancy, history of immunosuppression, history of IV drug use. 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   HPI  Past Medical History:  Diagnosis Date  . Anxiety   . GERD (gastroesophageal reflux disease)   . Hypertension     Patient Active Problem List   Diagnosis Date Noted  . Atypical chest pain 01/30/2012  . Anxiety 01/30/2012  . TOBACCO USER 02/03/2009  . OBESITY NOS 07/31/2006  . HYPERTENSION, ESSENTIAL NOS 07/31/2006    Past Surgical History:  Procedure Laterality Date  . CESAREAN SECTION      OB History    No data available       Home Medications    Prior to Admission medications   Medication Sig Start Date End Date Taking? Authorizing Provider  benzocaine (ORAJEL) 10 % mucosal gel Use as directed 1 application in the mouth or throat as needed for mouth pain.    [provider]  cholecalciferol (VITAMIN D) 1000 UNITS tablet Take 1,000 Units by mouth  2 (two) times daily.    [provider]  ibuprofen (ADVIL,MOTRIN) 200 MG tablet Take 400 mg by mouth every 6 (six) hours as needed for moderate pain.    [provider]  lisinopril (PRINIVIL,ZESTRIL) 20 MG tablet Take 20 mg by mouth daily.    [provider]  methocarbamol (ROBAXIN) 500 MG tablet Take 1 tablet (500 mg total) by mouth 2 (two) times daily. 01/01/15   Santiago Glad, PA-C  naproxen (NAPROSYN) 500 MG tablet Take 1 tablet (500 mg total) by mouth 2 (two) times daily. 01/01/15   Santiago Glad, PA-C  penicillin v potassium (VEETID) 500 MG tablet Take 1 tablet (500 mg total) by mouth 4 (four) times daily. 03/07/14   Earley Favor, NP  predniSONE (DELTASONE) 20 MG tablet 3 tabs po daily x 4 days 01/05/15   Street, [place], PA-C  RANITIDINE HCL PO Take 75 mg by mouth 2 (two) times daily.     [provider]  traMADol (ULTRAM) 50 MG tablet Take 1 tablet (50 mg total) by mouth every 6 (six) hours as needed. 01/01/15   Santiago Glad, PA-C    Family History Family History  Problem Relation Age of Onset  . Hypertension Mother   . Hypertension Father     Social History Social History  Substance Use Topics  . Smoking status: Current Some Day Smoker    Packs/day: 0.50    Types: Cigarettes  .  Smokeless tobacco: Never Used  . Alcohol use Yes     Comment: occasionally     Allergies   Citrus   Review of Systems Review of Systems  Constitutional: Negative for appetite change, chills, fatigue, fever and unexpected weight change.  HENT: Positive for congestion, rhinorrhea and sneezing. Negative for ear pain and sore throat.   Eyes: Negative for photophobia and visual disturbance.  Respiratory: Positive for cough. Negative for chest tightness, shortness of breath and wheezing.   Cardiovascular: Negative for chest pain and palpitations.  Gastrointestinal: Negative for abdominal pain, blood in stool, constipation, diarrhea, nausea and vomiting.    Genitourinary: Negative for dysuria, hematuria and urgency.  Musculoskeletal: Negative for myalgias.  Skin: Negative for rash.  Neurological: Negative for dizziness, weakness and light-headedness.     Physical Exam Updated Vital Signs BP (!) 151/84 (BP Location: Left Arm)   Pulse 61   Temp 98.3 F (36.8 C) (Oral)   Resp 18   Ht 5\' 3"  (1.6 m)   Wt 111.1 kg (245 lb)   LMP 03/19/2017   SpO2 98%   BMI 43.40 kg/m   Physical Exam  Constitutional: She appears well-developed and well-nourished. No distress.  HENT:  Head: Normocephalic and atraumatic.  Nose: Nose normal.  Eyes: Conjunctivae and EOM are normal. Right eye exhibits no discharge. Left eye exhibits no discharge. No scleral icterus.  Neck: Normal range of motion. Neck supple.  Cardiovascular: Normal rate, regular rhythm, normal heart sounds and intact distal pulses.  Exam reveals no gallop and no friction rub.   No murmur heard. Pulmonary/Chest: Effort normal and breath sounds normal. No respiratory distress.  Abdominal: Soft. Bowel sounds are normal. She exhibits no distension. There is no tenderness. There is no guarding.  Musculoskeletal: Normal range of motion. She exhibits no edema.  Neurological: She is alert. She exhibits normal muscle tone. Coordination normal.  Skin: Skin is warm and dry. No rash noted. There is erythema.  12 mm flat area of erythema noted on right forearm. No induration, swelling or other changes noted. No overlying cellulitis.  Psychiatric: She has a normal mood and affect.  Nursing note and vitals reviewed.    ED Treatments / Results  Labs (all labs ordered are listed, but only abnormal results are displayed) Labs Reviewed - No data to display  EKG  EKG Interpretation None       Radiology Dg Chest 2 View  Result Date: 03/21/2017 CLINICAL DATA:  Cough for 2 weeks. EXAM: CHEST  2 VIEW COMPARISON:  Chest x-ray dated January 30, 2012. FINDINGS: The cardiomediastinal silhouette is  normal in size. Normal pulmonary vascularity. No focal consolidation, pleural effusion, or pneumothorax. No acute osseous abnormality. IMPRESSION: No active cardiopulmonary disease. Electronically Signed   By: Obie Dredge M.D.   On: 03/21/2017 12:08    Procedures Procedures (including critical care time)  Medications Ordered in ED Medications - No data to display   Initial Impression / Assessment and Plan / ED Course  I have reviewed the triage vital signs and the nursing notes.  Pertinent labs & imaging results that were available during my care of the patient were reviewed by me and considered in my medical decision making (see chart for details).     Patient presents to ED for evaluation of possible local reaction to TB skin test placed. She had the test placed approximately 4 days ago and returned for recheck 2 days ago. She was cleared, however she has been having itching and redness  at the site after she got it checked 2 days ago. She also reports intermittent dry cough for the past 2 weeks due to a URI but denies any night sweats, hemoptysis, productive cough, fevers, weight loss, exposure to sick contacts, exposure to known TB, recent travel, prior BCG vaccine, history of malignancy, history of HIV, history of IV drug use, work in healthcare setting, homelessness. Physical exam patient is afebrile and is overall well-appearing. She does have a 12 mm area of erythema which is flat on R forearm. There is no induration, swelling or other changes noted. Chest x-ray returned as negative. I suspect that this is just a local reaction to the area, rather than a positive test due to the absence of induration, history, symptoms. She does state that she gets these types of reactions with injections or shots in the past. She has no risk factors for TB exposure. I did advise her to follow-up at the health department for further evaluation. She agrees with this plan. Patient appears stable for  discharge at this time. Strict return precautions given.  Final Clinical Impressions(s) / ED Diagnoses   Final diagnoses:  Localized skin eruption    New Prescriptions New Prescriptions   No medications on file     Dietrich PatesKhatri, Kelee Cunningham, Kaitlyn Robertson 03/21/17 1228    Dione BoozeGlick, David, MD 03/22/17 [phone]

## 2017-03-21 NOTE — ED Notes (Signed)
Declined W/C at D/C and was escorted to lobby by RN. 

## 2017-10-01 ENCOUNTER — Emergency Department (HOSPITAL_COMMUNITY)
Admission: EM | Admit: 2017-10-01 | Discharge: 2017-10-01 | Disposition: A | Discharge status: Home / Self Care | Payer: Self-pay | Attending: Emergency Medicine | Admitting: Emergency Medicine

## 2017-10-01 ENCOUNTER — Encounter (HOSPITAL_COMMUNITY): Payer: Self-pay | Admitting: Emergency Medicine

## 2017-10-01 ENCOUNTER — Emergency Department (HOSPITAL_COMMUNITY): Payer: Self-pay

## 2017-10-01 DIAGNOSIS — Z79899 Other long term (current) drug therapy: Secondary | ICD-10-CM | POA: Insufficient documentation

## 2017-10-01 DIAGNOSIS — Y929 Unspecified place or not applicable: Secondary | ICD-10-CM | POA: Insufficient documentation

## 2017-10-01 DIAGNOSIS — W51XXXA Accidental striking against or bumped into by another person, initial encounter: Secondary | ICD-10-CM | POA: Insufficient documentation

## 2017-10-01 DIAGNOSIS — S86911A Strain of unspecified muscle(s) and tendon(s) at lower leg level, right leg, initial encounter: Secondary | ICD-10-CM | POA: Insufficient documentation

## 2017-10-01 DIAGNOSIS — Y939 Activity, unspecified: Secondary | ICD-10-CM | POA: Insufficient documentation

## 2017-10-01 DIAGNOSIS — F17210 Nicotine dependence, cigarettes, uncomplicated: Secondary | ICD-10-CM | POA: Insufficient documentation

## 2017-10-01 DIAGNOSIS — I10 Essential (primary) hypertension: Secondary | ICD-10-CM | POA: Insufficient documentation

## 2017-10-01 DIAGNOSIS — Y999 Unspecified external cause status: Secondary | ICD-10-CM | POA: Insufficient documentation

## 2017-10-01 MED ORDER — IBUPROFEN 600 MG PO TABS: 600.0000 mg | ORAL_TABLET | Freq: Three times a day (TID) | ORAL | 0 refills | Status: DC | PRN
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
## 2017-10-01 NOTE — Discharge Instructions (Addendum)
You were evaluated in the emergency department for a right knee injury.  Your x-ray did not show an obvious fracture but did show a lot of arthritis.  You likely have some strain to your ligaments and some inflammation within the knee joint.  We are prescribing you an knee immobilizer to help limit the the knee bending and keep it from giving way.  He should take ibuprofen 3 times a day as needed for pain and use ice to your knee 20 minutes on 20 minutes off.  We are giving you a note for work.  Please follow-up with your doctor or return to the emergency department if any worsening symptoms.

## 2018-06-18 ENCOUNTER — Emergency Department (HOSPITAL_COMMUNITY)
Admission: EM | Admit: 2018-06-18 | Discharge: 2018-06-18 | Disposition: A | Discharge status: Home / Self Care | Payer: Self-pay | Attending: Emergency Medicine | Admitting: Emergency Medicine

## 2018-06-18 ENCOUNTER — Emergency Department (HOSPITAL_COMMUNITY): Payer: Self-pay

## 2018-06-18 DIAGNOSIS — S92515A Nondisplaced fracture of proximal phalanx of left lesser toe(s), initial encounter for closed fracture: Secondary | ICD-10-CM | POA: Insufficient documentation

## 2018-06-18 DIAGNOSIS — Y929 Unspecified place or not applicable: Secondary | ICD-10-CM | POA: Insufficient documentation

## 2018-06-18 DIAGNOSIS — W109XXA Fall (on) (from) unspecified stairs and steps, initial encounter: Secondary | ICD-10-CM | POA: Insufficient documentation

## 2018-06-18 DIAGNOSIS — Z79899 Other long term (current) drug therapy: Secondary | ICD-10-CM | POA: Insufficient documentation

## 2018-06-18 DIAGNOSIS — I10 Essential (primary) hypertension: Secondary | ICD-10-CM | POA: Insufficient documentation

## 2018-06-18 DIAGNOSIS — F17210 Nicotine dependence, cigarettes, uncomplicated: Secondary | ICD-10-CM | POA: Insufficient documentation

## 2018-06-18 DIAGNOSIS — Y939 Activity, unspecified: Secondary | ICD-10-CM | POA: Insufficient documentation

## 2018-06-18 DIAGNOSIS — Y999 Unspecified external cause status: Secondary | ICD-10-CM | POA: Insufficient documentation

## 2018-06-18 MED ORDER — HYDROCODONE-ACETAMINOPHEN 5-325 MG PO TABS: 1.0000 | ORAL_TABLET | ORAL | 0 refills | Status: DC | PRN

## 2018-06-18 MED ORDER — IBUPROFEN 800 MG PO TABS
800.0000 mg | ORAL_TABLET | Freq: Once | ORAL | Status: AC
Start: 2018-06-18 — End: 2018-06-18
  Administered 2018-06-18: 800 mg via ORAL
  Filled 2018-06-18: qty 1

## 2018-06-18 MED ORDER — HYDROCODONE-ACETAMINOPHEN 5-325 MG PO TABS
1.0000 | ORAL_TABLET | Freq: Once | ORAL | Status: AC
  Administered 2018-06-18: 1 via ORAL
  Filled 2018-06-18: qty 1

## 2018-06-18 MED ORDER — IBUPROFEN 600 MG PO TABS: 600.0000 mg | ORAL_TABLET | Freq: Four times a day (QID) | ORAL | 0 refills | Status: DC | PRN

## 2018-06-18 NOTE — ED Triage Notes (Signed)
Patient to ED c/o pain to L 4th toe - states she was going upstairs and stubbed it, no obvious injury, patient ambulatory.

## 2018-06-18 NOTE — ED Provider Notes (Signed)
[facility]   CSN: [id number] Arrival date & time: 06/18/18  1536     History   Chief Complaint Chief Complaint  Patient presents with  . Toe Pain    HPI Kaitlyn Robertson is a 53 y.o. female.  Pt presents to the ED today with left 4th toe pain.  She said she tripped going up the stairs.  She's been unable to move her toe since this happened.     Past Medical History:  Diagnosis Date  . Anxiety   . GERD (gastroesophageal reflux disease)   . Hypertension     Patient Active Problem List   Diagnosis Date Noted  . Atypical chest pain 01/30/2012  . Anxiety 01/30/2012  . TOBACCO USER 02/03/2009  . OBESITY NOS 07/31/2006  . HYPERTENSION, ESSENTIAL NOS 07/31/2006    Past Surgical History:  Procedure Laterality Date  . CESAREAN SECTION       OB History   No obstetric history on file.      Home Medications    Prior to Admission medications   Medication Sig Start Date End Date Taking? Authorizing Provider  benzocaine (ORAJEL) 10 % mucosal gel Use as directed 1 application in the mouth or throat as needed for mouth pain.    [provider]  cholecalciferol (VITAMIN D) 1000 UNITS tablet Take 1,000 Units by mouth 2 (two) times daily.    [provider]  HYDROcodone-acetaminophen (NORCO/VICODIN) 5-325 MG tablet Take 1 tablet by mouth every 4 (four) hours as needed. 06/18/18   Jacalyn LefevreHaviland, Linc Renne, MD  ibuprofen (ADVIL,MOTRIN) 600 MG tablet Take 1 tablet (600 mg total) by mouth every 6 (six) hours as needed. 06/18/18   Jacalyn LefevreHaviland, Tamesha Ellerbrock, MD  lisinopril (PRINIVIL,ZESTRIL) 20 MG tablet Take 20 mg by mouth daily.    [provider]  methocarbamol (ROBAXIN) 500 MG tablet Take 1 tablet (500 mg total) by mouth 2 (two) times daily. 01/01/15   Santiago GladLaisure, Heather, PA-C  naproxen (NAPROSYN) 500 MG tablet Take 1 tablet (500 mg total) by mouth 2 (two) times daily. 01/01/15   Santiago GladLaisure, Heather, PA-C  penicillin v potassium  (VEETID) 500 MG tablet Take 1 tablet (500 mg total) by mouth 4 (four) times daily. 03/07/14   Earley FavorSchulz, Gail, NP  predniSONE (DELTASONE) 20 MG tablet 3 tabs po daily x 4 days 01/05/15   Street, [place], PA-C  RANITIDINE HCL PO Take 75 mg by mouth 2 (two) times daily.     [provider]  traMADol (ULTRAM) 50 MG tablet Take 1 tablet (50 mg total) by mouth every 6 (six) hours as needed. 01/01/15   Santiago GladLaisure, Heather, PA-C    Family History Family History  Problem Relation Age of Onset  . Hypertension Mother   . Hypertension Father     Social History Social History   Tobacco Use  . Smoking status: Current Some Day Smoker    Packs/day: 0.50    Types: Cigarettes  . Smokeless tobacco: Never Used  Substance Use Topics  . Alcohol use: Yes    Comment: occasionally  . Drug use: No     Allergies   Citrus   Review of Systems Review of Systems  Musculoskeletal:       Left 4th toe pain  All other systems reviewed and are negative.    Physical Exam Updated Vital Signs BP (!) 171/83 (BP Location: Right Arm)   Pulse 72   Temp 98.7 F (37.1 C) (Oral)   Resp 20  LMP 06/11/2018   SpO2 99%   Physical Exam Vitals signs and nursing note reviewed.  Constitutional:      Appearance: Normal appearance. She is obese.  HENT:     Head: Normocephalic and atraumatic.     Right Ear: External ear normal.     Left Ear: External ear normal.     Nose: Nose normal.     Mouth/Throat:     Mouth: Mucous membranes are moist.  Eyes:     Extraocular Movements: Extraocular movements intact.     Pupils: Pupils are equal, round, and reactive to light.  Neck:     Musculoskeletal: Normal range of motion.  Cardiovascular:     Rate and Rhythm: Normal rate and regular rhythm.     Pulses: Normal pulses.     Heart sounds: Normal heart sounds.  Pulmonary:     Effort: Pulmonary effort is normal.     Breath sounds: Normal breath sounds.  Abdominal:     General: Abdomen is flat.    Musculoskeletal:       Feet:  Skin:    General: Skin is warm.     Capillary Refill: Capillary refill takes less than 2 seconds.     Comments: Vitiligo diffusely  Neurological:     General: No focal deficit present.     Mental Status: She is alert and oriented to person, place, and time.  Psychiatric:        Mood and Affect: Mood normal.        Behavior: Behavior normal.      ED Treatments / Results  Labs (all labs ordered are listed, but only abnormal results are displayed) Labs Reviewed - No data to display  EKG None  Radiology Dg Toe 4th Left  Result Date: 06/18/2018 CLINICAL DATA:  Injured toe while coming down stairs. Fourth toe pain. EXAM: LEFT FOURTH TOE COMPARISON:  None. FINDINGS: There is a relatively nondisplaced oblique coursing fracture of the proximal phalanx of the fourth toe. No intra-articular involvement. No other fractures are identified. IMPRESSION: Relatively nondisplaced fracture of the fourth proximal phalanx. Electronically Signed   By: Rudie Meyer M.D.   On: 06/18/2018 16:40    Procedures Procedures (including critical care time)  Medications Ordered in ED Medications  HYDROcodone-acetaminophen (NORCO/VICODIN) 5-325 MG per tablet 1 tablet (has no administration in time range)  ibuprofen (ADVIL,MOTRIN) tablet 800 mg (has no administration in time range)     Initial Impression / Assessment and Plan / ED Course  I have reviewed the triage vital signs and the nursing notes.  Pertinent labs & imaging results that were available during my care of the patient were reviewed by me and considered in my medical decision making (see chart for details).  Pt's toe is buddy-taped.  She is placed in a post-op shoe.  She is given 1 lortab and ibuprofen prior to d/c.  She is instructed to f/u with podiatry and to return if worse.  Final Clinical Impressions(s) / ED Diagnoses   Final diagnoses:  Closed nondisplaced fracture of proximal phalanx of lesser toe  of left foot, initial encounter    ED Discharge Orders         Ordered    HYDROcodone-acetaminophen (NORCO/VICODIN) 5-325 MG tablet  Every 4 hours PRN     06/18/18 1647    ibuprofen (ADVIL,MOTRIN) 600 MG tablet  Every 6 hours PRN     06/18/18 1647           Jacalyn Lefevre, MD  06/18/18 1648  

## 2018-12-24 ENCOUNTER — Other Ambulatory Visit: Payer: Self-pay

## 2018-12-24 ENCOUNTER — Emergency Department (HOSPITAL_COMMUNITY)
Admission: EM | Admit: 2018-12-24 | Discharge: 2018-12-24 | Disposition: A | Discharge status: Home / Self Care | Payer: Self-pay | Attending: Emergency Medicine | Admitting: Emergency Medicine

## 2018-12-24 DIAGNOSIS — K0889 Other specified disorders of teeth and supporting structures: Secondary | ICD-10-CM | POA: Insufficient documentation

## 2018-12-24 DIAGNOSIS — I10 Essential (primary) hypertension: Secondary | ICD-10-CM | POA: Insufficient documentation

## 2018-12-24 DIAGNOSIS — F17210 Nicotine dependence, cigarettes, uncomplicated: Secondary | ICD-10-CM | POA: Insufficient documentation

## 2018-12-24 DIAGNOSIS — R030 Elevated blood-pressure reading, without diagnosis of hypertension: Secondary | ICD-10-CM

## 2018-12-24 DIAGNOSIS — Z79899 Other long term (current) drug therapy: Secondary | ICD-10-CM | POA: Insufficient documentation

## 2018-12-24 LAB — POC URINE PREG, ED: Preg Test, Ur: NEGATIVE

## 2018-12-24 MED ORDER — CHLORHEXIDINE GLUCONATE 0.12 % MT SOLN: 15.0000 mL | Freq: Two times a day (BID) | OROMUCOSAL | 0 refills | Status: DC

## 2018-12-24 MED ORDER — HYDROCODONE-ACETAMINOPHEN 5-325 MG PO TABS
2.0000 | ORAL_TABLET | Freq: Once | ORAL | Status: AC
  Administered 2018-12-24: 12:00:00 2 via ORAL
  Filled 2018-12-24: qty 2

## 2018-12-24 MED ORDER — LIDOCAINE VISCOUS HCL 2 % MT SOLN
15.0000 mL | Freq: Once | OROMUCOSAL | Status: AC
  Administered 2018-12-24: 15 mL via OROMUCOSAL
  Filled 2018-12-24: qty 15

## 2018-12-24 MED ORDER — CLINDAMYCIN HCL 150 MG PO CAPS
450.0000 mg | ORAL_CAPSULE | Freq: Once | ORAL | Status: AC
  Administered 2018-12-24: 450 mg via ORAL
  Filled 2018-12-24: qty 3

## 2018-12-24 MED ORDER — CLINDAMYCIN HCL 150 MG PO CAPS: 450.0000 mg | ORAL_CAPSULE | Freq: Three times a day (TID) | ORAL | 0 refills | Status: AC

## 2018-12-24 NOTE — ED Provider Notes (Signed)
[facility] EMERGENCY DEPARTMENT Provider Note   CSN: [id number] Arrival date & time: 12/24/18  1026    History   Chief Complaint Chief Complaint  Patient presents with   Abscess    HPI Kaitlyn Robertson is a 53 y.o. female with history of anxiety, GERD, hypertension presents today with upper right frontal dental pain that began yesterday.  She reports a severe intensity constant throbbing sensation worsened with chewing and palpation and without alleviating factors, she took 1 Tylenol yesterday without relief of her symptoms.  She denies radiation of her pain.  Patient feels that the area is slightly swollen and is concerned for an abscess.  She has no established dentist.  She denies fever/chills, difficulty breathing/swallowing, swelling of the neck, drooling, headache, trismus, voice change, cough/shortness of breath or any additional concerns today.     HPI  Past Medical History:  Diagnosis Date   Anxiety    GERD (gastroesophageal reflux disease)    Hypertension     Patient Active Problem List   Diagnosis Date Noted   Atypical chest pain 01/30/2012   Anxiety 01/30/2012   TOBACCO USER 02/03/2009   OBESITY NOS 07/31/2006   HYPERTENSION, ESSENTIAL NOS 07/31/2006    Past Surgical History:  Procedure Laterality Date   CESAREAN SECTION       OB History   No obstetric history on file.      Home Medications    Prior to Admission medications   Medication Sig Start Date End Date Taking? Authorizing Provider  benzocaine (ORAJEL) 10 % mucosal gel Use as directed 1 application in the mouth or throat as needed for mouth pain.    [provider]  chlorhexidine (PERIDEX) 0.12 % solution Use as directed 15 mLs in the mouth or throat 2 (two) times daily. Rinse and spit. Do not swallow. 12/24/18   Nuala Alpha A, PA-C  cholecalciferol (VITAMIN D) 1000 UNITS tablet Take 1,000 Units by mouth 2 (two) times daily.    [provider]   clindamycin (CLEOCIN) 150 MG capsule Take 3 capsules (450 mg total) by mouth 3 (three) times daily for 7 days. 12/24/18 12/31/18  Nuala Alpha A, PA-C  HYDROcodone-acetaminophen (NORCO/VICODIN) 5-325 MG tablet Take 1 tablet by mouth every 4 (four) hours as needed. 06/18/18   Isla Pence, MD  ibuprofen (ADVIL,MOTRIN) 600 MG tablet Take 1 tablet (600 mg total) by mouth every 6 (six) hours as needed. 06/18/18   Isla Pence, MD  lisinopril (PRINIVIL,ZESTRIL) 20 MG tablet Take 20 mg by mouth daily.    [provider]  methocarbamol (ROBAXIN) 500 MG tablet Take 1 tablet (500 mg total) by mouth 2 (two) times daily. 01/01/15   Hyman Bible, PA-C  naproxen (NAPROSYN) 500 MG tablet Take 1 tablet (500 mg total) by mouth 2 (two) times daily. 01/01/15   Hyman Bible, PA-C  penicillin v potassium (VEETID) 500 MG tablet Take 1 tablet (500 mg total) by mouth 4 (four) times daily. 03/07/14   Junius Creamer, NP  predniSONE (DELTASONE) 20 MG tablet 3 tabs po daily x 4 days 01/05/15   Street, [place], PA-C  RANITIDINE HCL PO Take 75 mg by mouth 2 (two) times daily.     [provider]  traMADol (ULTRAM) 50 MG tablet Take 1 tablet (50 mg total) by mouth every 6 (six) hours as needed. 01/01/15   Hyman Bible, PA-C    Family History Family History  Problem Relation Age of Onset   Hypertension Mother  Hypertension Father     Social History Social History   Tobacco Use   Smoking status: Current Some Day Smoker    Packs/day: 0.50    Types: Cigarettes   Smokeless tobacco: Never Used  Substance Use Topics   Alcohol use: Yes    Comment: occasionally   Drug use: No     Allergies   Citrus   Review of Systems Review of Systems Ten systems are reviewed and are negative for acute change except as noted in the HPI.   Physical Exam Updated Vital Signs BP (!) 168/102    Pulse 76    Temp 99.3 F (37.4 C) (Oral)    Resp 16    SpO2 99%   Physical Exam Constitutional:        General: She is not in acute distress.    Appearance: Normal appearance. She is well-developed. She is not ill-appearing or diaphoretic.  HENT:     Head: Normocephalic and atraumatic.     Jaw: There is normal jaw occlusion. No trismus.     Right Ear: External ear normal.     Left Ear: External ear normal.     Nose: Nose normal. No rhinorrhea.     Right Sinus: No maxillary sinus tenderness.     Left Sinus: No maxillary sinus tenderness.     Mouth/Throat:     Comments: Very poor dentition overall multiple missing and cracked teeth.  Pain to percussion of tooth #12.  No gingival erythema or fluctuance noted.  No drainage.  Sinuses without induration, swelling, fluctuance or erythema. The patient has normal phonation and is in control of secretions. No stridor.  Midline uvula without edema. Soft palate rises symmetrically. No tonsillar erythema, swelling or exudates. Tongue protrusion is normal, floor of mouth is soft. No trismus. No creptius on neck palpation. Mucus membranes moist. Eyes:     General: Vision grossly intact. Gaze aligned appropriately.     Pupils: Pupils are equal, round, and reactive to light.  Neck:     Musculoskeletal: Normal range of motion.     Trachea: Trachea and phonation normal. No tracheal deviation.  Pulmonary:     Effort: Pulmonary effort is normal. No respiratory distress.  Abdominal:     General: There is no distension.     Palpations: Abdomen is soft.     Tenderness: There is no abdominal tenderness. There is no guarding or rebound.  Musculoskeletal: Normal range of motion.  Skin:    General: Skin is warm and dry.  Neurological:     Mental Status: She is alert.     GCS: GCS eye subscore is 4. GCS verbal subscore is 5. GCS motor subscore is 6.     Comments: Speech is clear and goal oriented, follows commands Major Cranial nerves without deficit, no facial droop Moves extremities without ataxia, coordination intact  Psychiatric:        Behavior:  Behavior normal.      ED Treatments / Results  Labs (all labs ordered are listed, but only abnormal results are displayed) Labs Reviewed  POC URINE PREG, ED    EKG None  Radiology No results found.  Procedures Procedures (including critical care time)  Medications Ordered in ED Medications  HYDROcodone-acetaminophen (NORCO/VICODIN) 5-325 MG per tablet 2 tablet (2 tablets Oral Given 12/24/18 1153)  clindamycin (CLEOCIN) capsule 450 mg (450 mg Oral Given 12/24/18 1152)  lidocaine (XYLOCAINE) 2 % viscous mouth solution 15 mL (15 mLs Mouth/Throat Given 12/24/18 1153)  Initial Impression / Assessment and Plan / ED Course  I have reviewed the triage vital signs and the nursing notes.  Pertinent labs & imaging results that were available during my care of the patient were reviewed by me and considered in my medical decision making (see chart for details).     Urine pregnancy test negative.  53 year old female presents today with right upper frontal dental pain since yesterday.  Very poor dentition overall with multiple dental surface cavities without signs or symptoms of dental abscess, no swelling/erythema/tenderness of the gums.  Maxillary sinuses without tenderness, erythema or swelling. Patient is well-appearing, afebrile, nontoxic, speaking well.  Patient able to swallow without pain.  No signs of swelling or concern for Ludwig's angina/Peritonsilar abscess/Retropharyngeal abscess or other deep tissue infections.  No sign of swelling of the neck, patient has good range of motion of the neck, no trismus.  Patient's significant other is here to take her home in a cab, she has been given 2 Norco for her pain here in the emergency department.  Started on antibiotics clindamycin 450 mg 3 times daily x7 days.  Patient reports improvement of pain here in the emergency department.  There is dental coverage today with Dr. Mia CreekLocklear will give referral and encouraged to call to schedule an  appointment today.  Will prescribe Peridex.  The patient was noted to have elevated BP in ED today.  Likely secondary to her dental pain. I have spoken with the patient regarding elevated blood pressure readings and the need for improved management. I instructed the patient to followup with their PCP within 1 week for BP check. I also counseled the patient regarding the signs and symptoms which would require an emergent visit to an emergency department for hypertensive urgency and/or hypertensive emergency.  At this time there does not appear to be any evidence of an acute emergency medical condition and the patient appears stable for discharge with appropriate outpatient follow up. Diagnosis was discussed with patient who verbalizes understanding of care plan and is agreeable to discharge. I have discussed return precautions with patient who verbalizes understanding of return precautions. Patient encouraged to follow-up with their PCP. All questions answered. Patient has been discharged in good condition.   Note: Portions of this report may have been transcribed using voice recognition software. Every effort was made to ensure accuracy; however, inadvertent computerized transcription errors may still be present. Final Clinical Impressions(s) / ED Diagnoses   Final diagnoses:  Pain, dental  Elevated blood pressure reading    ED Discharge Orders         Ordered    clindamycin (CLEOCIN) 150 MG capsule  3 times daily     12/24/18 1217    chlorhexidine (PERIDEX) 0.12 % solution  2 times daily     12/24/18 1217           Elizabeth PalauMorelli, Adonia Porada A, PA-C 12/24/18 1240    Alvira MondaySchlossman, Erin, MD 12/24/18 2117

## 2018-12-24 NOTE — ED Triage Notes (Signed)
Pt presents to the ED with an abscess to the right upper gum line. Reports that she thought it was just dental pain but then noticed it was an abscess yesterday/today. Pt reporting 10/10 throbbing pain at present time.

## 2018-12-24 NOTE — Discharge Instructions (Addendum)
You have been diagnosed today with dental pain.  At this time there does not appear to be the presence of an emergent medical condition, however there is always the potential for conditions to change. Please read and follow the below instructions.  Please return to the Emergency Department immediately for any new or worsening symptoms. Please be sure to follow up with your Primary Care Provider within one week regarding your visit today; please call their office to schedule an appointment even if you are feeling better for a follow-up visit. Please take antibiotic clindamycin 450 mg 3 times daily as prescribed to treat your dental infections.  Please call Dr. Haig Prophet today to schedule a follow-up appointment, he is the on-call dentist call him after you leave the emergency department to schedule an appointment. You may use the Peridex mouth rinse as prescribed to help avoid future infections, rinse and spit this medication do not swallow. You may use over-the-counter anti-inflammatory medications such as Tylenol and ibuprofen as directed on the packaging to help with your symptoms.  Please drink plenty of water to rest. Your blood pressure was elevated today in the emergency department.  Please call your primary care doctor this week to schedule a blood pressure recheck and medication management.  Get help right away if you: Get a very bad headache. Start to feel mixed up (confused). Feel weak or numb. Feel faint or dizzy Have very bad pain in your: Chest. Belly (abdomen). Throw up more than once. Have trouble breathing. You cannot open your mouth. You are having trouble breathing or swallowing. You have a fever. Your face, neck, or jaw is swollen. You have any new/concerning or worsening symptoms.  Please read the additional information packets attached to your discharge summary.  Do not take your medicine if  develop an itchy rash, swelling in your mouth or lips, or difficulty  breathing; call 911 and seek immediate emergency medical attention if this occurs.

## 2019-05-26 ENCOUNTER — Encounter (HOSPITAL_COMMUNITY): Payer: Self-pay | Admitting: *Deleted

## 2019-05-26 ENCOUNTER — Emergency Department (HOSPITAL_COMMUNITY)
Admission: EM | Admit: 2019-05-26 | Discharge: 2019-05-26 | Disposition: A | Discharge status: Home / Self Care | Payer: Self-pay | Attending: Emergency Medicine | Admitting: Emergency Medicine

## 2019-05-26 ENCOUNTER — Other Ambulatory Visit: Payer: Self-pay

## 2019-05-26 DIAGNOSIS — F17210 Nicotine dependence, cigarettes, uncomplicated: Secondary | ICD-10-CM | POA: Insufficient documentation

## 2019-05-26 DIAGNOSIS — N3000 Acute cystitis without hematuria: Secondary | ICD-10-CM | POA: Insufficient documentation

## 2019-05-26 DIAGNOSIS — I10 Essential (primary) hypertension: Secondary | ICD-10-CM | POA: Insufficient documentation

## 2019-05-26 DIAGNOSIS — Z79899 Other long term (current) drug therapy: Secondary | ICD-10-CM | POA: Insufficient documentation

## 2019-05-26 LAB — URINALYSIS, ROUTINE W REFLEX MICROSCOPIC
Bilirubin Urine: NEGATIVE
Glucose, UA: NEGATIVE mg/dL
Ketones, ur: NEGATIVE mg/dL
Nitrite: NEGATIVE
Protein, ur: 100 mg/dL — AB
RBC / HPF: >50 RBC/hpf — ABNORMAL HIGH (ref 0–5)
Specific Gravity, Urine: 1.013 (ref 1.005–1.030)
WBC, UA: >50 WBC/hpf — ABNORMAL HIGH (ref 0–5)
pH: 5 (ref 5.0–8.0)

## 2019-05-26 LAB — PREGNANCY, URINE: Preg Test, Ur: NEGATIVE

## 2019-05-26 MED ORDER — CEPHALEXIN 500 MG PO CAPS: ORAL_CAPSULE | ORAL | 0 refills | Status: DC

## 2019-05-26 NOTE — ED Provider Notes (Signed)
[facility]-EMERGENCY DEPT Provider Note   CSN: [id number] Arrival date & time: 05/26/19  [id number]     History Chief Complaint  Patient presents with  . Urinary Tract Infection    Kaitlyn Robertson is a 53 y.o. female   The history is provided by the patient. No language interpreter was used.  Urinary Tract Infection Pain quality:  Aching Pain severity:  Moderate Onset quality:  Gradual Duration:  5 days Timing:  Constant Progression:  Worsening Chronicity:  Recurrent Relieved by:  Nothing Worsened by:  Nothing Ineffective treatments:  Phenazopyridine and cranberry juice Urinary symptoms: frequent urination   Urinary symptoms: no discolored urine and no foul-smelling urine   Associated symptoms: abdominal pain and fever   Associated symptoms: no flank pain, no nausea, no vaginal discharge and no vomiting   Risk factors: recurrent urinary tract infections   Risk factors: no hx of pyelonephritis, no hx of urolithiasis, no kidney transplant, no renal disease, no sexually transmitted infections, no single kidney and no urinary catheter        Past Medical History:  Diagnosis Date  . Anxiety   . GERD (gastroesophageal reflux disease)   . Hypertension     Patient Active Problem List   Diagnosis Date Noted  . Atypical chest pain 01/30/2012  . Anxiety 01/30/2012  . TOBACCO USER 02/03/2009  . OBESITY NOS 07/31/2006  . HYPERTENSION, ESSENTIAL NOS 07/31/2006    Past Surgical History:  Procedure Laterality Date  . CESAREAN SECTION       OB History   No obstetric history on file.     Family History  Problem Relation Age of Onset  . Hypertension Mother   . Hypertension Father     Social History   Tobacco Use  . Smoking status: Current Some Day Smoker    Packs/day: 0.50    Types: Cigarettes  . Smokeless tobacco: Never Used  Substance Use Topics  . Alcohol use: Yes    Comment: occasionally  . Drug use: No    Home Medications Prior to  Admission medications   Medication Sig Start Date End Date Taking? Authorizing Provider  benzocaine (ORAJEL) 10 % mucosal gel Use as directed 1 application in the mouth or throat as needed for mouth pain.    [provider]  cephALEXin (KEFLEX) 500 MG capsule 2 caps po bid x 7 days 05/26/19   Arthor CaptainHarris, Tara Wich, PA-C  chlorhexidine (PERIDEX) 0.12 % solution Use as directed 15 mLs in the mouth or throat 2 (two) times daily. Rinse and spit. Do not swallow. 12/24/18   Harlene SaltsMorelli, Brandon A, PA-C  cholecalciferol (VITAMIN D) 1000 UNITS tablet Take 1,000 Units by mouth 2 (two) times daily.    [provider]  HYDROcodone-acetaminophen (NORCO/VICODIN) 5-325 MG tablet Take 1 tablet by mouth every 4 (four) hours as needed. 06/18/18   Jacalyn LefevreHaviland, Julie, MD  ibuprofen (ADVIL,MOTRIN) 600 MG tablet Take 1 tablet (600 mg total) by mouth every 6 (six) hours as needed. 06/18/18   Jacalyn LefevreHaviland, Julie, MD  lisinopril (PRINIVIL,ZESTRIL) 20 MG tablet Take 20 mg by mouth daily.    [provider]  methocarbamol (ROBAXIN) 500 MG tablet Take 1 tablet (500 mg total) by mouth 2 (two) times daily. 01/01/15   Santiago GladLaisure, Heather, PA-C  naproxen (NAPROSYN) 500 MG tablet Take 1 tablet (500 mg total) by mouth 2 (two) times daily. 01/01/15   Santiago GladLaisure, Heather, PA-C  penicillin v potassium (VEETID) 500 MG tablet Take 1 tablet (500 mg total) by  mouth 4 (four) times daily. 03/07/14   Junius Creamer, NP  predniSONE (DELTASONE) 20 MG tablet 3 tabs po daily x 4 days 01/05/15   Street, [place], PA-C  RANITIDINE HCL PO Take 75 mg by mouth 2 (two) times daily.     [provider]  traMADol (ULTRAM) 50 MG tablet Take 1 tablet (50 mg total) by mouth every 6 (six) hours as needed. 01/01/15   Hyman Bible, PA-C    Allergies    Citrus  Review of Systems   Review of Systems  Constitutional: Positive for fever.  HENT: Positive for sinus pressure and sinus pain. Negative for tinnitus and trouble swallowing.   Eyes: Negative.    Respiratory: Negative.   Cardiovascular: Negative.   Gastrointestinal: Positive for abdominal pain. Negative for nausea and vomiting.  Genitourinary: Negative for flank pain and vaginal discharge.  Musculoskeletal: Negative.   Skin: Negative.   Psychiatric/Behavioral: Negative.     Physical Exam Updated Vital Signs BP (!) 187/118 (BP Location: Left Arm)   Pulse 96   Temp 100.1 F (37.8 C) (Oral)   Resp 16   Ht 5\' 3"  (1.6 m)   Wt 111.1 kg   SpO2 96%   BMI 43.40 kg/m   Physical Exam Vitals and nursing note reviewed.  Constitutional:      General: She is not in acute distress.    Appearance: She is well-developed. She is not diaphoretic.  HENT:     Head: Normocephalic and atraumatic.  Eyes:     General: No scleral icterus.    Conjunctiva/sclera: Conjunctivae normal.  Cardiovascular:     Rate and Rhythm: Normal rate and regular rhythm.     Heart sounds: Normal heart sounds. No murmur. No friction rub. No gallop.   Pulmonary:     Effort: Pulmonary effort is normal. No respiratory distress.     Breath sounds: Normal breath sounds.  Abdominal:     General: Bowel sounds are normal. There is no distension.     Palpations: Abdomen is soft. There is no mass.     Tenderness: There is no abdominal tenderness. There is no right CVA tenderness, left CVA tenderness or guarding.  Musculoskeletal:     Cervical back: Normal range of motion.  Skin:    General: Skin is warm and dry.  Neurological:     Mental Status: She is alert and oriented to person, place, and time.  Psychiatric:        Behavior: Behavior normal.     ED Results / Procedures / Treatments   Labs (all labs ordered are listed, but only abnormal results are displayed) Labs Reviewed  URINALYSIS, ROUTINE W REFLEX MICROSCOPIC - Abnormal; Notable for the following components:      Result Value   APPearance CLOUDY (*)    Hgb urine dipstick LARGE (*)    Protein, ur 100 (*)    Leukocytes,Ua MODERATE (*)    RBC /  HPF >50 (*)    WBC, UA >50 (*)    Bacteria, UA MANY (*)    All other components within normal limits  PREGNANCY, URINE    EKG None  Radiology No results found.  Procedures Procedures (including critical care time)  Medications Ordered in ED Medications - No data to display  ED Course  I have reviewed the triage vital signs and the nursing notes.  Pertinent labs & imaging results that were available during my care of the patient were reviewed by me and considered in my medical  decision making (see chart for details).    MDM Rules/Calculators/A&P                     Patient here with fever last night, urinary frequency, suprapubic pain. Denies flank pain. Hx of recurrent UTIs. Urine appears infected on my interpretation. Pt has been diagnosed with a UTI. Pt is afebrile, no CVA tenderness, normotensive, and denies N/V. Pt to be dc home with antibiotics and instructions to follow up with PCP if symptoms persist.  Final Clinical Impression(s) / ED Diagnoses Final diagnoses:  Acute cystitis without hematuria    Rx / DC Orders ED Discharge Orders         Ordered    cephALEXin (KEFLEX) 500 MG capsule     05/26/19 1052           Arthor Captain, PA-C 05/26/19 1130    Donnetta Hutching, MD 05/27/19 [phone]

## 2019-05-26 NOTE — ED Triage Notes (Addendum)
Pt states she started with discoloration last week, couple days ago noticed odor and different sensation with urination, states she had a fever last night but did not check it. Also states she thinks she has a sinus infection and has been taking OTC meds. She has not taken her bp meds this morning

## 2019-05-26 NOTE — Discharge Instructions (Addendum)
Contact a health care provider if:  Your symptoms do not get better after 1-2 days.  Your symptoms go away and then return.  Get help right away if you have:  Severe pain in your back or your lower abdomen.  A fever.  Nausea or vomiting.

## 2019-05-28 ENCOUNTER — Encounter (HOSPITAL_COMMUNITY): Payer: Self-pay

## 2019-05-28 ENCOUNTER — Other Ambulatory Visit: Payer: Self-pay

## 2019-05-28 DIAGNOSIS — N390 Urinary tract infection, site not specified: Secondary | ICD-10-CM | POA: Insufficient documentation

## 2019-05-28 DIAGNOSIS — Z20822 Contact with and (suspected) exposure to covid-19: Secondary | ICD-10-CM | POA: Insufficient documentation

## 2019-05-28 DIAGNOSIS — R519 Headache, unspecified: Secondary | ICD-10-CM | POA: Insufficient documentation

## 2019-05-28 DIAGNOSIS — R05 Cough: Secondary | ICD-10-CM | POA: Insufficient documentation

## 2019-05-28 DIAGNOSIS — I10 Essential (primary) hypertension: Secondary | ICD-10-CM | POA: Insufficient documentation

## 2019-05-28 DIAGNOSIS — F17210 Nicotine dependence, cigarettes, uncomplicated: Secondary | ICD-10-CM | POA: Insufficient documentation

## 2019-05-28 DIAGNOSIS — Z79899 Other long term (current) drug therapy: Secondary | ICD-10-CM | POA: Insufficient documentation

## 2019-05-28 NOTE — ED Triage Notes (Signed)
Pt reports that she was prescribed cephalexin on the 31st and states that since she started taking it and feeling "exhausted and winded." Also reports headache, cough. Denies swelling or itching. 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

## 2019-05-29 ENCOUNTER — Encounter (HOSPITAL_COMMUNITY): Payer: Self-pay | Admitting: Emergency Medicine

## 2019-05-29 ENCOUNTER — Emergency Department (HOSPITAL_COMMUNITY): Payer: Self-pay

## 2019-05-29 ENCOUNTER — Emergency Department (HOSPITAL_COMMUNITY)
Admission: EM | Admit: 2019-05-29 | Discharge: 2019-05-29 | Disposition: A | Discharge status: Home / Self Care | Payer: Self-pay | Attending: Emergency Medicine | Admitting: Emergency Medicine

## 2019-05-29 DIAGNOSIS — N390 Urinary tract infection, site not specified: Secondary | ICD-10-CM

## 2019-05-29 DIAGNOSIS — R531 Weakness: Secondary | ICD-10-CM

## 2019-05-29 LAB — URINALYSIS, ROUTINE W REFLEX MICROSCOPIC
Bilirubin Urine: NEGATIVE
Glucose, UA: NEGATIVE mg/dL
Hgb urine dipstick: NEGATIVE
Ketones, ur: NEGATIVE mg/dL
Nitrite: POSITIVE — AB
Protein, ur: NEGATIVE mg/dL
Specific Gravity, Urine: 1.014 (ref 1.005–1.030)
WBC, UA: >50 WBC/hpf — ABNORMAL HIGH (ref 0–5)
pH: 5 (ref 5.0–8.0)

## 2019-05-29 LAB — POC SARS CORONAVIRUS 2 AG -  ED: SARS Coronavirus 2 Ag: NEGATIVE

## 2019-05-29 LAB — BASIC METABOLIC PANEL
Anion gap: 11 (ref 5–15)
BUN: 12 mg/dL (ref 6–20)
CO2: 26 mmol/L (ref 22–32)
Calcium: 8.9 mg/dL (ref 8.9–10.3)
Chloride: 103 mmol/L (ref 98–111)
Creatinine, Ser: 0.68 mg/dL (ref 0.44–1.00)
GFR calc Af Amer: >60 mL/min (ref >60)
GFR calc non Af Amer: >60 mL/min (ref >60)
Glucose, Bld: 104 mg/dL — ABNORMAL HIGH (ref 70–99)
Potassium: 3.6 mmol/L (ref 3.5–5.1)
Sodium: 140 mmol/L (ref 135–145)

## 2019-05-29 LAB — CBC
HCT: 43.5 % (ref 36.0–46.0)
Hemoglobin: 14.1 g/dL (ref 12.0–15.0)
MCH: 26.2 pg (ref 26.0–34.0)
MCHC: 32.4 g/dL (ref 30.0–36.0)
MCV: 80.7 fL (ref 80.0–100.0)
Platelets: 261 K/uL (ref 150–400)
RBC: 5.39 MIL/uL — ABNORMAL HIGH (ref 3.87–5.11)
RDW: 14 % (ref 11.5–15.5)
WBC: 8 K/uL (ref 4.0–10.5)
nRBC: 0 % (ref 0.0–0.2)

## 2019-05-29 LAB — TROPONIN I (HIGH SENSITIVITY): Troponin I (High Sensitivity): 3 ng/L (ref <18)

## 2019-05-29 MED ORDER — CEFTRIAXONE SODIUM 1 G IJ SOLR
1.0000 g | Freq: Once | INTRAMUSCULAR | Status: AC
  Administered 2019-05-29: 1 g via INTRAMUSCULAR
  Filled 2019-05-29: qty 10

## 2019-05-29 MED ORDER — STERILE WATER FOR INJECTION IJ SOLN
INTRAMUSCULAR | Status: AC
  Administered 2019-05-29: 1.5 mL
  Filled 2019-05-29: qty 10

## 2019-05-29 MED ORDER — AMOXICILLIN-POT CLAVULANATE 875-125 MG PO TABS: 1.0000 | ORAL_TABLET | Freq: Two times a day (BID) | ORAL | 0 refills | Status: DC

## 2019-05-29 NOTE — ED Notes (Signed)
Patient ambulated to room from triage without complication or assistance form staff or device.

## 2019-05-29 NOTE — ED Notes (Signed)
Portable Chest X-Ray at bedside. 

## 2019-05-29 NOTE — ED Notes (Signed)
Urine culture sent down to lab with urinalysis. 

## 2019-05-29 NOTE — ED Notes (Signed)
Patient ambulated to the restroom without assistance 

## 2019-05-29 NOTE — ED Notes (Signed)
Pt updated about wait 

## 2019-05-29 NOTE — Discharge Instructions (Addendum)
Stop Keflex and take augmentin. Follow up with your doctor next week. Drink plenty of fluids.

## 2019-05-29 NOTE — ED Provider Notes (Signed)
[facility]   CSN: [id number] Arrival date & time: 05/28/19  1955     History Chief Complaint  Patient presents with  . Medication Reaction    Kaitlyn Robertson is a 54 y.o. female.  HPI  54 year old female history of hypertension and reflux presents today complaining of cough, dyspnea on exertion that began on Friday into Saturday.  Last Tuesday she began feeling generally weak and had some subjective fever and chills.  At that time she noted that she had some urine that had a bad odor.  She was seen in the ED on Thursday and had Keflex started for UTI.  She is also tested for Covid.  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  She reports that she had some sinus symptoms last week with some pressure in between her eyes.  This has improved.  She denies any sore throat or loss of smell.  She states that she has had poor appetite but has not had vomiting or diarrhea.  She has no known home contact sick or Covid.  She visit with significant other.     Past Medical History:  Diagnosis Date  . Anxiety   . GERD (gastroesophageal reflux disease)   . Hypertension     Patient Active Problem List   Diagnosis Date Noted  . Atypical chest pain 01/30/2012  . Anxiety 01/30/2012  . TOBACCO USER 02/03/2009  . OBESITY NOS 07/31/2006  . HYPERTENSION, ESSENTIAL NOS 07/31/2006    Past Surgical History:  Procedure Laterality Date  . CESAREAN SECTION       OB History   No obstetric history on file.     Family History  Problem Relation Age of Onset  . Hypertension Mother   . Hypertension Father     Social History   Tobacco Use  . Smoking status: Current Some Day Smoker    Packs/day: 0.50    Types: Cigarettes  . Smokeless tobacco: Never Used  Substance Use Topics  . Alcohol use: Yes    Comment: occasionally  . Drug use: No    Home Medications Prior to Admission medications     Medication Sig Start Date End Date Taking? Authorizing Provider  benzocaine (ORAJEL) 10 % mucosal gel Use as directed 1 application in the mouth or throat as needed for mouth pain.    [provider]  cephALEXin (KEFLEX) 500 MG capsule 2 caps po bid x 7 days 05/26/19   Arthor Captain, PA-C  chlorhexidine (PERIDEX) 0.12 % solution Use as directed 15 mLs in the mouth or throat 2 (two) times daily. Rinse and spit. Do not swallow. 12/24/18   Harlene Salts A, PA-C  cholecalciferol (VITAMIN D) 1000 UNITS tablet Take 1,000 Units by mouth 2 (two) times daily.    [provider]  HYDROcodone-acetaminophen (NORCO/VICODIN) 5-325 MG tablet Take 1 tablet by mouth every 4 (four) hours as needed. 06/18/18   Jacalyn Lefevre, MD  ibuprofen (ADVIL,MOTRIN) 600 MG tablet Take 1 tablet (600 mg total) by mouth every 6 (six) hours as needed. 06/18/18   Jacalyn Lefevre, MD  lisinopril (PRINIVIL,ZESTRIL) 20 MG tablet Take 20 mg by mouth daily.    [provider]  methocarbamol (ROBAXIN) 500 MG tablet Take 1 tablet (500 mg total) by mouth 2 (two) times daily. 01/01/15   Santiago Glad, PA-C  naproxen (NAPROSYN) 500 MG tablet Take 1 tablet (500 mg total) by  mouth 2 (two) times daily. 01/01/15   Hyman Bible, PA-C  penicillin v potassium (VEETID) 500 MG tablet Take 1 tablet (500 mg total) by mouth 4 (four) times daily. 03/07/14   Junius Creamer, NP  predniSONE (DELTASONE) 20 MG tablet 3 tabs po daily x 4 days 01/05/15   Street, [place], PA-C  RANITIDINE HCL PO Take 75 mg by mouth 2 (two) times daily.     [provider]  traMADol (ULTRAM) 50 MG tablet Take 1 tablet (50 mg total) by mouth every 6 (six) hours as needed. 01/01/15   Hyman Bible, PA-C    Allergies    Citrus  Review of Systems   Review of Systems  All other systems reviewed and are negative.   Physical Exam Updated Vital Signs BP (!) 164/83 (BP Location: Left Arm)   Pulse 85   Temp 98.7 F (37.1 C) (Oral)   Resp  16   SpO2 97%   Physical Exam Vitals and nursing note reviewed.  Constitutional:      General: She is not in acute distress.    Appearance: Normal appearance. She is obese. She is not ill-appearing.  HENT:     Head: Normocephalic.     Right Ear: External ear normal.     Left Ear: External ear normal.     Nose: Nose normal.     Mouth/Throat:     Mouth: Mucous membranes are moist.  Eyes:     Pupils: Pupils are equal, round, and reactive to light.  Cardiovascular:     Rate and Rhythm: Normal rate.     Pulses: Normal pulses.     Heart sounds: Normal heart sounds.  Pulmonary:     Effort: Pulmonary effort is normal.  Abdominal:     General: Abdomen is flat.  Musculoskeletal:        General: Normal range of motion.     Cervical back: Normal range of motion.  Skin:    General: Skin is warm and dry.     Capillary Refill: Capillary refill takes less than 2 seconds.  Neurological:     General: No focal deficit present.     Mental Status: She is alert and oriented to person, place, and time.  Psychiatric:        Mood and Affect: Mood normal.     ED Results / Procedures / Treatments   Labs (all labs ordered are listed, but only abnormal results are displayed) Labs Reviewed  BASIC METABOLIC PANEL  CBC  POC SARS CORONAVIRUS 2 AG -  ED  TROPONIN I (HIGH SENSITIVITY)    EKG EKG Interpretation  Date/Time:  Sunday May 29 2019 07:42:55 EST Ventricular Rate:  79 PR Interval:    QRS Duration: 94 QT Interval:  435 QTC Calculation: 499 R Axis:   56 Text Interpretation: Sinus rhythm Borderline prolonged QT interval No significant change since last tracing Confirmed by Pattricia Boss [phone]) on 05/29/2019 8:29:57 AM   Radiology DG Chest Port 1 View  Result Date: 05/29/2019 CLINICAL DATA:  Pt reports that she was prescribed cephalexin on the 31st and states that since she started taking it and feeling "exhausted and winded." Also reports headache, cough. Medical hx of HTN and  GERD. EXAM: PORTABLE CHEST 1 VIEW COMPARISON:  Chest radiograph 03/21/2017 FINDINGS: Stable cardiomediastinal contours with normal heart size. The lungs are clear. No pneumothorax or large pleural effusion. No acute finding in the visualized skeleton. IMPRESSION: No evidence of active disease in the chest. Electronically Signed  By: Emmaline Kluver M.D.   On: 05/29/2019 08:01    Procedures Procedures (including critical care time)  Medications Ordered in ED Medications - No data to display  ED Course  I have reviewed the triage vital signs and the nursing notes.  Pertinent labs & imaging results that were available during my care of the patient were reviewed by me and considered in my medical decision making (see chart for details).    MDM Rules/Calculators/A&P                     UTI- repeat urine with wbc >50 Plan rocephin im and will change keflex to augmentin Suspect symptoms due to ongoing uti.  No evidence of sepsis Discussed return precautions and need for follow up  Final Clinical Impression(s) / ED Diagnoses Final diagnoses:  Urinary tract infection without hematuria, site unspecified  Weakness    Rx / DC Orders ED Discharge Orders    None       Margarita Grizzle, MD 05/29/19 [phone]

## 2019-05-31 LAB — URINE CULTURE: Culture: >=100000 — AB

## 2019-06-01 ENCOUNTER — Telehealth: Payer: Self-pay | Admitting: Emergency Medicine

## 2019-06-01 NOTE — Telephone Encounter (Signed)
Post ED Visit - Positive Culture Follow-up  Culture report reviewed by antimicrobial stewardship pharmacist: [facility] []  , Pharm.D. []  Enzo Bi, Pharm.D., BCPS AQ-ID []  , Pharm.D., BCPS []  Celedonio Miyamoto, Pharm.D., BCPS []  [place], Garvin Fila.D., BCPS, AAHIVP []  , Pharm.D., BCPS, AAHIVP []  Georgina Pillion, PharmD, BCPS []  , PharmD, BCPS []  [place], PharmD, BCPS []  [place], PharmD []  , PharmD, BCPS []  Estella Husk, PharmD  Pharmacy Team []  Lysle Pearl, PharmD []  , PharmD []  Phillips Climes, PharmD []  , Rph []  Agapito Games) , PharmD []  Verlan Friends, PharmD []  , PharmD []  Mervyn Gay, PharmD []  , PharmD []  Vinnie Level, PharmD []  [facility], PharmD []  , PharmD []  Len Childs, PharmD   Positive urine culture Treated with amoxicillin, organism sensitive to the same and no further patient follow-up is required at this time.  06/01/2019, 5:06 PM

## 2019-08-29 ENCOUNTER — Other Ambulatory Visit: Payer: Self-pay

## 2019-08-29 ENCOUNTER — Emergency Department (HOSPITAL_COMMUNITY)
Admission: EM | Admit: 2019-08-29 | Discharge: 2019-08-29 | Disposition: A | Discharge status: Home / Self Care | Payer: PRIVATE HEALTH INSURANCE | Attending: Emergency Medicine | Admitting: Emergency Medicine

## 2019-08-29 ENCOUNTER — Emergency Department (HOSPITAL_COMMUNITY): Payer: PRIVATE HEALTH INSURANCE

## 2019-08-29 ENCOUNTER — Encounter (HOSPITAL_COMMUNITY): Payer: Self-pay | Admitting: Obstetrics and Gynecology

## 2019-08-29 DIAGNOSIS — I10 Essential (primary) hypertension: Secondary | ICD-10-CM | POA: Diagnosis not present

## 2019-08-29 DIAGNOSIS — F17210 Nicotine dependence, cigarettes, uncomplicated: Secondary | ICD-10-CM | POA: Diagnosis not present

## 2019-08-29 DIAGNOSIS — M5432 Sciatica, left side: Secondary | ICD-10-CM | POA: Diagnosis not present

## 2019-08-29 DIAGNOSIS — Z79899 Other long term (current) drug therapy: Secondary | ICD-10-CM | POA: Diagnosis not present

## 2019-08-29 DIAGNOSIS — M25552 Pain in left hip: Secondary | ICD-10-CM | POA: Diagnosis present

## 2019-08-29 HISTORY — DX: Sciatica, unspecified side: M54.30

## 2019-08-29 MED ORDER — LISINOPRIL 20 MG PO TABS
20.0000 mg | ORAL_TABLET | Freq: Once | ORAL | Status: AC
  Administered 2019-08-29: 14:00:00 20 mg via ORAL
  Filled 2019-08-29: qty 1

## 2019-08-29 MED ORDER — METHOCARBAMOL 500 MG PO TABS: 500.0000 mg | ORAL_TABLET | Freq: Three times a day (TID) | ORAL | 0 refills | Status: DC | PRN

## 2019-08-29 MED ORDER — HYDROCODONE-ACETAMINOPHEN 5-325 MG PO TABS
2.0000 | ORAL_TABLET | Freq: Once | ORAL | Status: AC
  Administered 2019-08-29: 16:00:00 2 via ORAL
  Filled 2019-08-29: qty 2

## 2019-08-29 MED ORDER — HYDROCODONE-ACETAMINOPHEN 5-325 MG PO TABS: 1.0000 | ORAL_TABLET | ORAL | 0 refills | Status: DC | PRN

## 2019-08-29 MED ORDER — KETOROLAC TROMETHAMINE 30 MG/ML IJ SOLN
30.0000 mg | Freq: Once | INTRAMUSCULAR | Status: AC
  Administered 2019-08-29: 14:00:00 30 mg via INTRAMUSCULAR
  Filled 2019-08-29: qty 1

## 2019-08-29 MED ORDER — NAPROXEN 375 MG PO TABS: 375.0000 mg | ORAL_TABLET | Freq: Two times a day (BID) | ORAL | 0 refills | Status: DC

## 2019-08-29 MED ORDER — HYDROMORPHONE HCL 2 MG/ML IJ SOLN
2.0000 mg | Freq: Once | INTRAMUSCULAR | Status: AC
  Administered 2019-08-29: 2 mg via INTRAMUSCULAR
  Filled 2019-08-29: qty 1

## 2019-08-29 NOTE — ED Triage Notes (Signed)
Patient reports left sided hip pain that radiates down her leg and across her back. 10/10 pain. Patient reports she has hx of sciatica and tried stretching, epsom salts bath and was not successful in relieving pain.

## 2019-08-29 NOTE — ED Notes (Signed)
Patient transported to XR. 

## 2019-08-29 NOTE — Discharge Instructions (Signed)
If you develop worsening, recurrent, or continued back pain, numbness or weakness in the legs, incontinence of your bowels or bladders, numbness of your buttocks, fever, abdominal pain, or any other new/concerning symptoms then return to the ER for evaluation.   You are being prescribed naproxen. Do not take Ibuprofen/Advil/Aleve/Motrin/Goody Powders/Naproxen/BC powders/Meloxicam/Diclofenac/Indomethacin and other Nonsteroidal anti-inflammatory medications

## 2019-08-29 NOTE — ED Notes (Signed)
ED Provider at bedside. 

## 2019-08-29 NOTE — ED Provider Notes (Signed)
[facility]-EMERGENCY DEPT Provider Note   CSN: [id number] Arrival date & time: 08/29/19  1304     History Chief Complaint  Patient presents with  . Hip Pain    Kaitlyn Robertson is a 54 y.o. female.  HPI 54 year old female presents with severe low back pain running down her left leg.  Has had this pain for about 7 years but never been this severe.  Normally it is much milder.  No specific injury.  No bowel/bladder incontinence or urinary symptoms.  No abdominal pain or fever.  Does not feel weak or numb in her leg.  It radiates down the posterior aspect of her leg.  Tried Advil, Epson salts, and massage device.   Past Medical History:  Diagnosis Date  . Anxiety   . GERD (gastroesophageal reflux disease)   . Hypertension   . Sciatica     Patient Active Problem List   Diagnosis Date Noted  . Atypical chest pain 01/30/2012  . Anxiety 01/30/2012  . TOBACCO USER 02/03/2009  . OBESITY NOS 07/31/2006  . HYPERTENSION, ESSENTIAL NOS 07/31/2006    Past Surgical History:  Procedure Laterality Date  . CESAREAN SECTION       OB History   No obstetric history on file.     Family History  Problem Relation Age of Onset  . Hypertension Mother   . Hypertension Father     Social History   Tobacco Use  . Smoking status: Current Some Day Smoker    Packs/day: 0.50    Types: Cigarettes  . Smokeless tobacco: Never Used  Substance Use Topics  . Alcohol use: Yes    Comment: occasionally  . Drug use: No    Home Medications Prior to Admission medications   Medication Sig Start Date End Date Taking? Authorizing Provider  cholecalciferol (VITAMIN D) 1000 UNITS tablet Take 1,000 Units by mouth daily.    Yes [provider]  lisinopril (PRINIVIL,ZESTRIL) 20 MG tablet Take 20 mg by mouth daily.   Yes [provider]  Multiple Vitamin (MULTIVITAMIN ADULT) TABS Take 1 tablet by mouth daily.   Yes [provider]    HYDROcodone-acetaminophen (NORCO) 5-325 MG tablet Take 1 tablet by mouth every 4 (four) hours as needed for severe pain. 08/29/19   Pricilla Loveless, MD  methocarbamol (ROBAXIN) 500 MG tablet Take 1 tablet (500 mg total) by mouth every 8 (eight) hours as needed for muscle spasms. 08/29/19   Pricilla Loveless, MD  naproxen (NAPROSYN) 375 MG tablet Take 1 tablet (375 mg total) by mouth 2 (two) times daily. 08/29/19   Pricilla Loveless, MD  traMADol (ULTRAM) 50 MG tablet Take 1 tablet (50 mg total) by mouth every 6 (six) hours as needed. Patient not taking: Reported on 08/29/2019 01/01/15   Santiago Glad, PA-C    Allergies    Citrus  Review of Systems   Review of Systems  Constitutional: Negative for fever.  Gastrointestinal: Negative for abdominal pain.  Genitourinary: Negative for dysuria.       No incontinence  Musculoskeletal: Positive for back pain.  Neurological: Negative for weakness and numbness.  All other systems reviewed and are negative.   Physical Exam Updated Vital Signs BP 134/77   Pulse 73   Temp 98 F (36.7 C) (Oral) Comment: Simultaneous filing. User may not have seen previous data.  Resp 18   Ht 5\' 3"  (1.6 m)   Wt 113.4 kg   LMP 08/11/2019 (Approximate) Comment: tubaligation  SpO2 99%   BMI 44.29 kg/m   Physical Exam Vitals and nursing note reviewed.  Constitutional:      Appearance: She is well-developed. She is obese.  HENT:     Head: Normocephalic and atraumatic.     Right Ear: External ear normal.     Left Ear: External ear normal.     Nose: Nose normal.  Eyes:     General:        Right eye: No discharge.        Left eye: No discharge.  Cardiovascular:     Rate and Rhythm: Normal rate and regular rhythm.     Heart sounds: Normal heart sounds.  Pulmonary:     Effort: Pulmonary effort is normal.     Breath sounds: Normal breath sounds.  Abdominal:     Palpations: Abdomen is soft.     Tenderness: There is no abdominal tenderness.  Musculoskeletal:      Comments: No focal back tenderness  Skin:    General: Skin is warm and dry.  Neurological:     Mental Status: She is alert.     Comments: Normal sensation in BLE. RLE 5/5 strength. LLE strength testing limited pain that it causes  Psychiatric:        Mood and Affect: Mood is not anxious.     ED Results / Procedures / Treatments   Labs (all labs ordered are listed, but only abnormal results are displayed) Labs Reviewed - No data to display  EKG None  Radiology DG Hip Unilat W or Wo Pelvis 2-3 Views Left  Result Date: 08/29/2019 CLINICAL DATA:  Left buttock pain EXAM: DG HIP (WITH OR WITHOUT PELVIS) 2-3V LEFT COMPARISON:  None. FINDINGS: Moderate degenerative changes in the hips bilaterally with joint space narrowing and spurring. SI joints symmetric and unremarkable. No acute bony abnormality. Specifically, no fracture, subluxation, or dislocation. IMPRESSION: Degenerative changes in the hips.  No acute bony abnormality. Electronically Signed   By: Charlett Nose M.D.   On: 08/29/2019 15:18    Procedures Procedures (including critical care time)  Medications Ordered in ED Medications  lisinopril (ZESTRIL) tablet 20 mg (20 mg Oral Given 08/29/19 1331)  HYDROmorphone (DILAUDID) injection 2 mg (2 mg Intramuscular Given 08/29/19 1331)  ketorolac (TORADOL) 30 MG/ML injection 30 mg (30 mg Intramuscular Given 08/29/19 1331)  HYDROcodone-acetaminophen (NORCO/VICODIN) 5-325 MG per tablet 2 tablet (2 tablets Oral Given 08/29/19 1601)    ED Course  I have reviewed the triage vital signs and the nursing notes.  Pertinent labs & imaging results that were available during my care of the patient were reviewed by me and considered in my medical decision making (see chart for details).    MDM Rules/Calculators/A&P                      Patient's pain is improved with IM Dilaudid and IM Toradol.  She is able to ambulate though with a limp.  Strength appears better and I think originally her decreased  strength was pain related.  Highly doubt acute spinal cord emergency.  Given a lot of the pain she is experiencing in her buttock/hip, x-ray obtained but is negative.  Personally reviewed this x-ray.  Otherwise, I think she will need supportive care with pain control, NSAIDs, muscle relaxer.  This appears to be sciatica, unclear what is causing her flare.  Follow-up with her PCP.  Discussed return precautions. Final Clinical Impression(s) / ED Diagnoses Final diagnoses:  Sciatica of left side    Rx / DC Orders ED Discharge Orders         Ordered    HYDROcodone-acetaminophen (NORCO) 5-325 MG tablet  Every 4 hours PRN     08/29/19 1551    methocarbamol (ROBAXIN) 500 MG tablet  Every 8 hours PRN     08/29/19 1551    naproxen (NAPROSYN) 375 MG tablet  2 times daily     08/29/19 1551           Sherwood Gambler, MD 08/29/19 1631

## 2020-11-14 ENCOUNTER — Telehealth: Payer: Self-pay

## 2020-11-14 NOTE — Telephone Encounter (Signed)
Patient was call 2x and not able to leave a voicemail. The upcoming appt with Dr. Laural Benes on 6/27 will be a virtual visit. If patient returns call please make aware appt will be virtual but if in person is preferred they will need to reschedule for another day

## 2020-11-19 ENCOUNTER — Ambulatory Visit: Payer: PRIVATE HEALTH INSURANCE | Attending: Internal Medicine | Admitting: Internal Medicine

## 2020-11-19 ENCOUNTER — Other Ambulatory Visit: Payer: Self-pay

## 2021-01-18 ENCOUNTER — Ambulatory Visit: Payer: PRIVATE HEALTH INSURANCE | Attending: Internal Medicine | Admitting: Internal Medicine

## 2021-01-18 ENCOUNTER — Other Ambulatory Visit: Payer: Self-pay

## 2021-01-18 ENCOUNTER — Encounter: Payer: Self-pay | Admitting: Internal Medicine

## 2021-01-18 DIAGNOSIS — F17200 Nicotine dependence, unspecified, uncomplicated: Secondary | ICD-10-CM | POA: Diagnosis not present

## 2021-01-18 DIAGNOSIS — I10 Essential (primary) hypertension: Secondary | ICD-10-CM | POA: Diagnosis not present

## 2021-01-18 NOTE — Progress Notes (Signed)
Patient ID: Kaitlyn Robertson, female   DOB: 1965-11-05, 55 y.o.   MRN: [id number] Virtual Visit via Telephone Note  I connected with Kaitlyn Robertson on 01/18/2021 at 8:45 a.m by telephone and verified that I am speaking with the correct person using two identifiers  Location: Patient: home Provider: office  Participants: Myself Patient   I discussed the limitations, risks, security and privacy concerns of performing an evaluation and management service by telephone and the availability of in person appointments. I also discussed with the patient that there may be a patient responsible charge related to this service. The patient expressed understanding and agreed to proceed.   History of Present Illness: Patient with history of HTN, obesity, tobacco dependence, anxiety, OA hips, sciatica.  Patient wanting to establish care. No PCP in a while.  Patient had young children in the background who were playing and talking loudly.  She intermittently was talking back-and-forth to them while trying to carry on a conversation with me.  This made it a bit difficult in doing this initial assessment via phone. Pt gives hx of HTN, sciatica, GERD, anx/dep. She was on Lisinopril for BP.  Out of med x 1 yr.  Checks BP once a mth at her sister-in-laws. Last check in July, it was 148/80-82.  She limits salt in foods.  No swelling in the legs.  Current smoker about 4 cigarettes a day.  Working toward quitting.  Previously 1/2 pk a day. Smoked since age 12.  Quit in past during pregnancy.  Not sure what it will take to quit now.  Never tried patches.     Outpatient Encounter Medications as of 01/18/2021  Medication Sig   cholecalciferol (VITAMIN D) 1000 UNITS tablet Take 1,000 Units by mouth daily.    lisinopril (PRINIVIL,ZESTRIL) 20 MG tablet Take 20 mg by mouth daily.   methocarbamol (ROBAXIN) 500 MG tablet Take 1 tablet (500 mg total) by mouth every 8 (eight) hours as needed for muscle spasms.   Multiple Vitamin  (MULTIVITAMIN ADULT) TABS Take 1 tablet by mouth daily.   [DISCONTINUED] HYDROcodone-acetaminophen (NORCO) 5-325 MG tablet Take 1 tablet by mouth every 4 (four) hours as needed for severe pain.   [DISCONTINUED] naproxen (NAPROSYN) 375 MG tablet Take 1 tablet (375 mg total) by mouth 2 (two) times daily.   [DISCONTINUED] traMADol (ULTRAM) 50 MG tablet Take 1 tablet (50 mg total) by mouth every 6 (six) hours as needed. (Patient not taking: Reported on 08/29/2019)   No facility-administered encounter medications on file as of 01/18/2021.      Observations/Objective: No direct observation done  Assessment and Plan: 1. Essential hypertension Patient with history of hypertension but has been off medicine for 1 year.  I recommend that we schedule an in person visit fairly soon.  In the meantime advised her to check her blood pressure whenever she goes to a store like [employer] or [employer] that has a blood pressure device they are that she can use.  Advised to write the numbers down and bring in readings on her in person visit with me.  DASH diet discussed and encouraged.  Recommend that she comes to the lab to have some baseline blood test done before her in person visit. - CBC; Future - Comprehensive metabolic panel; Future - Lipid panel; Future  2. Tobacco dependence Pt is current smoker. Patient advised to quit smoking. Discussed health risks associated with smoking including lung and other types of cancers, chronic lung diseases and CV risks.Marland Kitchen Pt  ready to give trail of quitting.   Discussed methods to help quit including quitting cold [place], use of NRT, Chantix and Bupropion.  Pt wanting to try: Patient does not want to try any medication at this time.  She will work on trying to quit on her own.  Have encouraged her to set a quit date. _2_ Minutes spent on counseling. F/U:    Follow Up Instructions: 1 mth   I discussed the assessment and treatment plan with the patient. The patient was provided  an opportunity to ask questions and all were answered. The patient agreed with the plan and demonstrated an understanding of the instructions.   The patient was advised to call back or seek an in-person evaluation if the symptoms worsen or if the condition fails to improve as anticipated.  I  Spent 11 minutes on this telephone encounter  Jonah Blue, MD

## 2021-01-23 ENCOUNTER — Other Ambulatory Visit: Payer: PRIVATE HEALTH INSURANCE

## 2021-01-25 ENCOUNTER — Other Ambulatory Visit: Payer: PRIVATE HEALTH INSURANCE

## 2021-01-30 ENCOUNTER — Telehealth: Payer: Self-pay

## 2021-01-30 NOTE — Telephone Encounter (Signed)
-----   Message from Marcine Matar, MD sent at 01/18/2021  1:01 PM EDT ----- Given in-person visit in 1 mth for BP check

## 2021-01-30 NOTE — Telephone Encounter (Signed)
Appointment was made on 8/26 for in person visit.

## 2021-03-01 ENCOUNTER — Other Ambulatory Visit: Payer: Self-pay

## 2021-03-01 ENCOUNTER — Ambulatory Visit: Payer: 59 | Attending: Family Medicine

## 2021-03-01 DIAGNOSIS — I10 Essential (primary) hypertension: Secondary | ICD-10-CM

## 2021-03-02 ENCOUNTER — Other Ambulatory Visit: Payer: Self-pay | Admitting: Internal Medicine

## 2021-03-02 LAB — CBC
Hematocrit: 45.3 % (ref 34.0–46.6)
Hemoglobin: 14.5 g/dL (ref 11.1–15.9)
MCH: 25.7 pg — ABNORMAL LOW (ref 26.6–33.0)
MCHC: 32 g/dL (ref 31.5–35.7)
MCV: 80 fL (ref 79–97)
Platelets: 187 10*3/uL (ref 150–450)
RBC: 5.64 x10E6/uL — ABNORMAL HIGH (ref 3.77–5.28)
RDW: 14.9 % (ref 11.7–15.4)
WBC: 10.3 10*3/uL (ref 3.4–10.8)

## 2021-03-02 LAB — COMPREHENSIVE METABOLIC PANEL
ALT: 12 IU/L (ref 0–32)
AST: 12 IU/L (ref 0–40)
Albumin/Globulin Ratio: 1.5 (ref 1.2–2.2)
Albumin: 4.2 g/dL (ref 3.8–4.9)
Alkaline Phosphatase: 85 IU/L (ref 44–121)
BUN/Creatinine Ratio: 13 (ref 9–23)
BUN: 12 mg/dL (ref 6–24)
Bilirubin Total: 0.3 mg/dL (ref 0.0–1.2)
CO2: 20 mmol/L (ref 20–29)
Calcium: 9.4 mg/dL (ref 8.7–10.2)
Chloride: 104 mmol/L (ref 96–106)
Creatinine, Ser: 0.92 mg/dL (ref 0.57–1.00)
Globulin, Total: 2.8 g/dL (ref 1.5–4.5)
Glucose: 108 mg/dL — ABNORMAL HIGH (ref 70–99)
Potassium: 4.2 mmol/L (ref 3.5–5.2)
Sodium: 143 mmol/L (ref 134–144)
Total Protein: 7 g/dL (ref 6.0–8.5)
eGFR: 74 mL/min/{1.73_m2} (ref >59)

## 2021-03-02 LAB — LIPID PANEL
Chol/HDL Ratio: 6.7 ratio — ABNORMAL HIGH (ref 0.0–4.4)
Cholesterol, Total: 227 mg/dL — ABNORMAL HIGH (ref 100–199)
HDL: 34 mg/dL — ABNORMAL LOW (ref >39)
LDL Chol Calc (NIH): 155 mg/dL — ABNORMAL HIGH (ref 0–99)
Triglycerides: 204 mg/dL — ABNORMAL HIGH (ref 0–149)
VLDL Cholesterol Cal: 38 mg/dL (ref 5–40)

## 2021-03-02 MED ORDER — ATORVASTATIN CALCIUM 10 MG PO TABS
10.0000 mg | ORAL_TABLET | Freq: Every day | ORAL | 3 refills | Status: DC
  Filled 2021-03-02: qty 30, 30d supply, fill #0

## 2021-03-04 ENCOUNTER — Other Ambulatory Visit: Payer: Self-pay

## 2021-03-07 ENCOUNTER — Telehealth: Payer: Self-pay

## 2021-03-07 NOTE — Telephone Encounter (Signed)
Contacted pt to go over lab results pt didn't answer  ° °Sent a CRM and forward labs to NT to give pt labs when they call back   °

## 2021-03-11 ENCOUNTER — Other Ambulatory Visit: Payer: Self-pay

## 2021-03-11 ENCOUNTER — Ambulatory Visit: Payer: PRIVATE HEALTH INSURANCE | Admitting: Internal Medicine

## 2021-04-29 ENCOUNTER — Ambulatory Visit: Payer: PRIVATE HEALTH INSURANCE | Attending: Internal Medicine | Admitting: Internal Medicine

## 2021-04-29 ENCOUNTER — Other Ambulatory Visit: Payer: Self-pay

## 2021-04-29 ENCOUNTER — Encounter: Payer: Self-pay | Admitting: Internal Medicine

## 2021-04-29 VITALS — BP 159/110 | HR 88 | Resp 16 | Ht 63.0 in | Wt 276.6 lb

## 2021-04-29 DIAGNOSIS — Z1231 Encounter for screening mammogram for malignant neoplasm of breast: Secondary | ICD-10-CM

## 2021-04-29 DIAGNOSIS — E782 Mixed hyperlipidemia: Secondary | ICD-10-CM | POA: Insufficient documentation

## 2021-04-29 DIAGNOSIS — L80 Vitiligo: Secondary | ICD-10-CM | POA: Diagnosis not present

## 2021-04-29 DIAGNOSIS — M25561 Pain in right knee: Secondary | ICD-10-CM

## 2021-04-29 DIAGNOSIS — Z23 Encounter for immunization: Secondary | ICD-10-CM | POA: Diagnosis not present

## 2021-04-29 DIAGNOSIS — F17200 Nicotine dependence, unspecified, uncomplicated: Secondary | ICD-10-CM

## 2021-04-29 DIAGNOSIS — Z2821 Immunization not carried out because of patient refusal: Secondary | ICD-10-CM

## 2021-04-29 DIAGNOSIS — M5416 Radiculopathy, lumbar region: Secondary | ICD-10-CM

## 2021-04-29 DIAGNOSIS — M25562 Pain in left knee: Secondary | ICD-10-CM

## 2021-04-29 DIAGNOSIS — Z1159 Encounter for screening for other viral diseases: Secondary | ICD-10-CM

## 2021-04-29 DIAGNOSIS — F17210 Nicotine dependence, cigarettes, uncomplicated: Secondary | ICD-10-CM | POA: Diagnosis not present

## 2021-04-29 DIAGNOSIS — Z6841 Body Mass Index (BMI) 40.0 and over, adult: Secondary | ICD-10-CM

## 2021-04-29 DIAGNOSIS — Z114 Encounter for screening for human immunodeficiency virus [HIV]: Secondary | ICD-10-CM

## 2021-04-29 DIAGNOSIS — G8929 Other chronic pain: Secondary | ICD-10-CM

## 2021-04-29 DIAGNOSIS — I10 Essential (primary) hypertension: Secondary | ICD-10-CM | POA: Diagnosis not present

## 2021-04-29 DIAGNOSIS — E559 Vitamin D deficiency, unspecified: Secondary | ICD-10-CM

## 2021-04-29 DIAGNOSIS — M546 Pain in thoracic spine: Secondary | ICD-10-CM

## 2021-04-29 DIAGNOSIS — Z1211 Encounter for screening for malignant neoplasm of colon: Secondary | ICD-10-CM

## 2021-04-29 MED ORDER — LISINOPRIL 10 MG PO TABS
10.0000 mg | ORAL_TABLET | Freq: Every day | ORAL | 3 refills | Status: DC
  Filled 2021-04-29: qty 30, 30d supply, fill #0

## 2021-04-29 MED ORDER — ATORVASTATIN CALCIUM 10 MG PO TABS
10.0000 mg | ORAL_TABLET | Freq: Every day | ORAL | 3 refills | Status: DC
  Filled 2021-04-29: qty 30, 30d supply, fill #0

## 2021-04-29 MED ORDER — MELOXICAM 7.5 MG PO TABS
7.5000 mg | ORAL_TABLET | Freq: Every day | ORAL | 6 refills | Status: DC
  Filled 2021-04-29: qty 30, 30d supply, fill #0

## 2021-04-29 NOTE — Patient Instructions (Signed)

## 2021-04-29 NOTE — Progress Notes (Signed)
Patient ID: Kaitlyn Robertson, female    DOB: [birth year]  MRN: [id number]  CC: Hypertension, premenopause, and Back Pain (Lower. Pain worse at night )   Subjective: Seniyah Robertson is a 55 y.o. female who presents for chronic ds management Her concerns today include:  Patient with history of HTN, obesity, tob dep, anxiety/dep, GERD, OA hips, sciatica.  Vitilago  HTN: pt was suppose to f/u sooner, no showed last visit -she limits salt in foods Currently not on any med.  Was on Lisinopril in past. 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  Saw dermatologist for it many yrs ago.  She declines referral at this time.  Wondering whether she needs to be on vitamin D.  States she was on very high dose of vitamin D because of the vitiligo.  She is not sure whether she also had vitamin D deficiency.  HL: I went over labs with her that were done 02/2021.  She did not receive her results.  Total cholesterol was 227 with LDL of 155.  C/o lower back pain which she states is sciatica. Was out of work for 1 mth last yr due to sciatica Pain across lower back that is intermittent.  Wakes in morning with pain. Gets better once she gets going.  Can come back if she does too much strenuous activities during the day. Can radiate down LT leg at times.  New pain b/w shoulder blade x 1 mth.  Just woke up with it 1 mth ago but better than it was initially.   Pain in both knees which she states is chronic. pain RT knee since 2005 due to work related injury where an obese pt feel on her leg.  Seen by company doctor and given brace which did not fit.  Had x-ray of knee 2019 that revealed OA changes.   Takes Ibuprofen OTC when needed but not on a daily bases  Obesity:  walks with her grandkids when she takes them to the park 2-4 times a wk. She has cut back on snacking and sodas. Eating  more fruits and veggies  HM:  declines flu and PCV.  Agrees to get Tdapt. Due for colon CA screen; fhx of colon CA in maternal great aunt.  Prefers c-scope.  Due for MMG. Patient Active Problem List   Diagnosis Date Noted   Atypical chest pain 01/30/2012   Anxiety 01/30/2012   TOBACCO USER 02/03/2009   OBESITY NOS 07/31/2006   HYPERTENSION, ESSENTIAL NOS 07/31/2006     Current Outpatient Medications on File Prior to Visit  Medication Sig Dispense Refill   cholecalciferol (VITAMIN D) 1000 UNITS tablet Take 1,000 Units by mouth daily.  (Patient not taking: Reported on 04/29/2021)     Multiple Vitamin (MULTIVITAMIN ADULT) TABS Take 1 tablet by mouth daily. (Patient not taking: Reported on 04/29/2021)     No current facility-administered medications on file prior to visit.    Allergies  Allergen Reactions   Citrus Hives    Social History   Socioeconomic History   Marital status: Single    Spouse name: Not on file   Number of children: Not on file   Years of education: Not on file   Highest education level: Not on file  Occupational History   Not on file  Tobacco Use  Smoking status: Some Days    Packs/day: 0.50    Types: Cigarettes   Smokeless tobacco: Never  Vaping Use   Vaping Use: Never used  Substance and Sexual Activity   Alcohol use: Yes    Comment: occasionally   Drug use: No   Sexual activity: Not Currently  Other Topics Concern   Not on file  Social History Narrative   Not on file   Social Determinants of Health   Financial Resource Strain: Not on file  Food Insecurity: Not on file  Transportation Needs: Not on file  Physical Activity: Not on file  Stress: Not on file  Social Connections: Not on file  Intimate Partner Violence: Not on file    Family History  Problem Relation Age of Onset   Hypertension Mother    Hypertension Father     Past Surgical History:  Procedure Laterality Date   CESAREAN SECTION      ROS: Review of  Systems Negative except as stated above  PHYSICAL EXAM: BP (!) 159/110   Pulse 88   Resp 16   Ht 5\' 3"  (1.6 m)   Wt 276 lb 9.6 oz (125.5 kg)   SpO2 95%   BMI 49.00 kg/m   Wt Readings from Last 3 Encounters:  04/29/21 276 lb 9.6 oz (125.5 kg)  08/29/19 250 lb (113.4 kg)  05/29/19 (S) 245 lb 6 oz (111.3 kg)    Physical Exam  General appearance - alert, well appearing, obese middle-age African-American female and in no distress Mental status - normal mood, behavior, speech, dress, motor activity, and thought processes Mouth - mucous membranes moist, pharynx normal without lesions Neck - supple, no significant adenopathy Chest - clear to auscultation, no wheezes, rales or rhonchi, symmetric air entry Heart - normal rate, regular rhythm, normal S1, S2, no murmurs, rubs, clicks or gallops Neurological -power in both lower extremities 5/5 bilaterally.  Gross sensation intact in the lower extremities.  Straight leg raise negative. Musculoskeletal -mild tenderness on palpation of the midline upper lumbar spine.  No tenderness on surrounding paraspinal muscles.  No tenderness on palpation of the thoracic spine between the shoulder blades. Knees: Large body habitus.  No point tenderness.  She has mild discomfort with passive range of motion of the left knee joint. Extremities -no lower extremity edema. Skin: Hypopigmented changes consistent with vitiligo scattered over the face and upper and lower extremities.  The 10-year ASCVD risk score (Arnett DK, et al., 2019) is: 32.2%   Values used to calculate the score:     Age: 74 years     Sex: Female     Is Non-Hispanic African American: Yes     Diabetic: No     Tobacco smoker: Yes     Systolic Blood Pressure: 159 mmHg     Is BP treated: Yes     HDL Cholesterol: 34 mg/dL     Total Cholesterol: 227 mg/dL   CMP Latest Ref Rng & Units 03/01/2021 05/29/2019 03/06/2014  Glucose 70 - 99 mg/dL 05/06/2014) 144(Y) 185(U)  BUN 6 - 24 mg/dL 12 12 6    Creatinine 0.57 - 1.00 mg/dL 314(H 7.02  Sodium 134 - 144 mmol/L 143 140 135(L)  Potassium 3.5 - 5.2 mmol/L 4.2 3.6 3.5(L)  Chloride 96 - 106 mmol/L 104 103 96  CO2 20 - 29 mmol/L 20 26 24   Calcium 8.7 - 10.2 mg/dL 9.4 8.9 9.3  Total Protein 6.0 - 8.5 g/dL 7.0 - 8.0  Total Bilirubin 0.0 -  1.2 mg/dL 0.3 - 0.5  Alkaline Phos 44 - 121 IU/L 85 - 63  AST 0 - 40 IU/L 12 - 11  ALT 0 - 32 IU/L 12 - 8   Lipid Panel     Component Value Date/Time   CHOL 227 (H) 03/01/2021 0943   TRIG 204 (H) 03/01/2021 0943   HDL 34 (L) 03/01/2021 0943   CHOLHDL 6.7 (H) 03/01/2021 0943   CHOLHDL 5.3 Ratio 07/31/2006 2043   VLDL 18 07/31/2006 2043   LDLCALC 155 (H) 03/01/2021 0943    CBC    Component Value Date/Time   WBC 10.3 03/01/2021 0943   WBC 8.0 05/29/2019 0809   RBC 5.64 (H) 03/01/2021 0943   RBC 5.39 (H) 05/29/2019 0809   HGB 14.5 03/01/2021 0943   HCT 45.3 03/01/2021 0943   PLT 187 03/01/2021 0943   MCV 80 03/01/2021 0943   MCH 25.7 (L) 03/01/2021 0943   MCH 26.2 05/29/2019 0809   MCHC 32.0 03/01/2021 0943   MCHC 32.4 05/29/2019 0809   RDW 14.9 03/01/2021 0943   LYMPHSABS 2.4 03/06/2014 2131   MONOABS 1.2 (H) 03/06/2014 2131   EOSABS 0.0 03/06/2014 2131   BASOSABS 0.0 03/06/2014 2131    ASSESSMENT AND PLAN:  1. Essential hypertension Not at goal. Patient with elevation of both systolic and diastolic BP.  I recommended using 2 medications but patient prefers to just go with the lisinopril first.  Low-salt diet encouraged which she states she is already doing.  I will have her follow-up with the clinical pharmacist in 2 weeks for repeat blood pressure check. - lisinopril (ZESTRIL) 10 MG tablet; Take 1 tablet (10 mg total) by mouth daily.  Dispense: 30 tablet; Refill: 3  2. Tobacco dependence Advised to quit.  Commended her on cutting down.  Patient not ready to give a trial of quitting  3. Morbid obesity with BMI of 45.0-49.9, adult [facility]) Discussed health risks associated  with being obese.  Encouraged weight loss through healthy eating habits and regular exercise.  Dietary counseling given along with printed information.  Encouraged her to aim for 30 minutes of moderate intensity exercise 3 to 5 days a week as tolerated.  4. Mixed hyperlipidemia Discussed putting her on statin therapy to help decrease cardiovascular events.  Informed of possible side effects of the medication including drug-induced hepatitis and muscle cramps.  Her baseline LFTs normal. - atorvastatin (LIPITOR) 10 MG tablet; Take 1 tablet (10 mg total) by mouth daily.  Dispense: 30 tablet; Refill: 3  5. Vitiligo Patient declines referral to dermatology - VITAMIN D 25 Hydroxy (Vit-D Deficiency, Fractures)  6. Chronic pain of both knees Likely progression of arthritis.  Discussed the importance of weight loss to help take mechanical strain off of the joints.  We will start her on low-dose meloxicam.  Update baseline x-rays. - meloxicam (MOBIC) 7.5 MG tablet; Take 1 tablet (7.5 mg total) by mouth daily.  Dispense: 30 tablet; Refill: 6 - DG Knee Complete 4 Views Left; Future - DG Knee Complete 4 Views Right; Future  7. Chronic radicular pain of lower back Discussed and encouraged weight loss.  We will get some baseline x-rays.  Start meloxicam.  Use of heating pad also recommended. - meloxicam (MOBIC) 7.5 MG tablet; Take 1 tablet (7.5 mg total) by mouth daily.  Dispense: 30 tablet; Refill: 6 - DG Lumbar Spine Complete; Future  8. Acute midline thoracic back pain See #7 above.  9. Vitamin D deficiency - VITAMIN D 25  Hydroxy (Vit-D Deficiency, Fractures)  10. Encounter for screening mammogram for malignant neoplasm of breast - MM Digital Screening; Future  11. Screening for colon cancer Discussed colon cancer screening recommendations and methods.  Patient prefers to have colonoscopy. - Ambulatory referral to Gastroenterology  12. Need for hepatitis C screening test - Hepatitis C  Antibody  13. Screening for HIV (human immunodeficiency virus) - HIV antibody (with reflex)  14. Influenza vaccination declined   15. Pneumococcal vaccination declined   16. Need for Tdap vaccination Patient agreeable to receiving Tdap today.  Given today by CMA   Patient was given the opportunity to ask questions.  Patient verbalized understanding of the plan and was able to repeat key elements of the plan.   Orders Placed This Encounter  Procedures   MM Digital Screening   DG Knee Complete 4 Views Left   DG Knee Complete 4 Views Right   DG Lumbar Spine Complete   Tdap vaccine greater than or equal to 7yo IM   VITAMIN D 25 Hydroxy (Vit-D Deficiency, Fractures)   Hepatitis C Antibody   HIV antibody (with reflex)   Ambulatory referral to Gastroenterology     Requested Prescriptions   Signed Prescriptions Disp Refills   lisinopril (ZESTRIL) 10 MG tablet 30 tablet 3    Sig: Take 1 tablet (10 mg total) by mouth daily.   atorvastatin (LIPITOR) 10 MG tablet 30 tablet 3    Sig: Take 1 tablet (10 mg total) by mouth daily.   meloxicam (MOBIC) 7.5 MG tablet 30 tablet 6    Sig: Take 1 tablet (7.5 mg total) by mouth daily.    Return in about 4 months (around 08/28/2021) for Appt with [facility] in 2 wks for BP check.  Jonah Blue, MD, FACP

## 2021-04-30 LAB — HEPATITIS C ANTIBODY: Hep C Virus Ab: <0.1 s/co ratio (ref 0.0–0.9)

## 2021-04-30 LAB — HIV ANTIBODY (ROUTINE TESTING W REFLEX): HIV Screen 4th Generation wRfx: NONREACTIVE

## 2021-04-30 LAB — VITAMIN D 25 HYDROXY (VIT D DEFICIENCY, FRACTURES): Vit D, 25-Hydroxy: 17.8 ng/mL — ABNORMAL LOW (ref 30.0–100.0)

## 2021-05-01 ENCOUNTER — Other Ambulatory Visit: Payer: Self-pay

## 2021-05-01 ENCOUNTER — Telehealth: Payer: Self-pay

## 2021-05-01 ENCOUNTER — Other Ambulatory Visit: Payer: Self-pay | Admitting: Internal Medicine

## 2021-05-01 MED ORDER — VITAMIN D3 25 MCG (1000 UT) PO CAPS
1000.0000 [IU] | ORAL_CAPSULE | Freq: Every day | ORAL | 1 refills | Status: DC
  Filled 2021-05-01: qty 100, 100d supply, fill #0

## 2021-05-01 NOTE — Telephone Encounter (Signed)
Contacted pt to go over lab results was unable to reach pt due to call can't be completed   Sent a CRM and forward labs to NT to give pt labs when they call back   

## 2021-10-16 ENCOUNTER — Telehealth: Payer: Self-pay | Admitting: Pharmacist

## 2021-10-16 NOTE — Telephone Encounter (Signed)
Patient attempted to be outreached by Sinda Du, PharmD Candidate on 10/16/21 to discuss hypertension.   Phone number was not in service  Catie Hedwig Morton, PharmD, [facility] [phone]

## 2021-11-30 IMAGING — DX DG CHEST 1V PORT
1 series · 1 of 1 positions shown · non-contrast
Comparison: Chest radiograph 03/21/2017

CLINICAL DATA: Pt reports that she was prescribed cephalexin [REDACTED] and states that since she started taking it and feeling
"exhausted and winded." Also reports headache, cough. Medical hx of
HTN and GERD.

EXAM:
PORTABLE CHEST 1 VIEW

[chest ap]
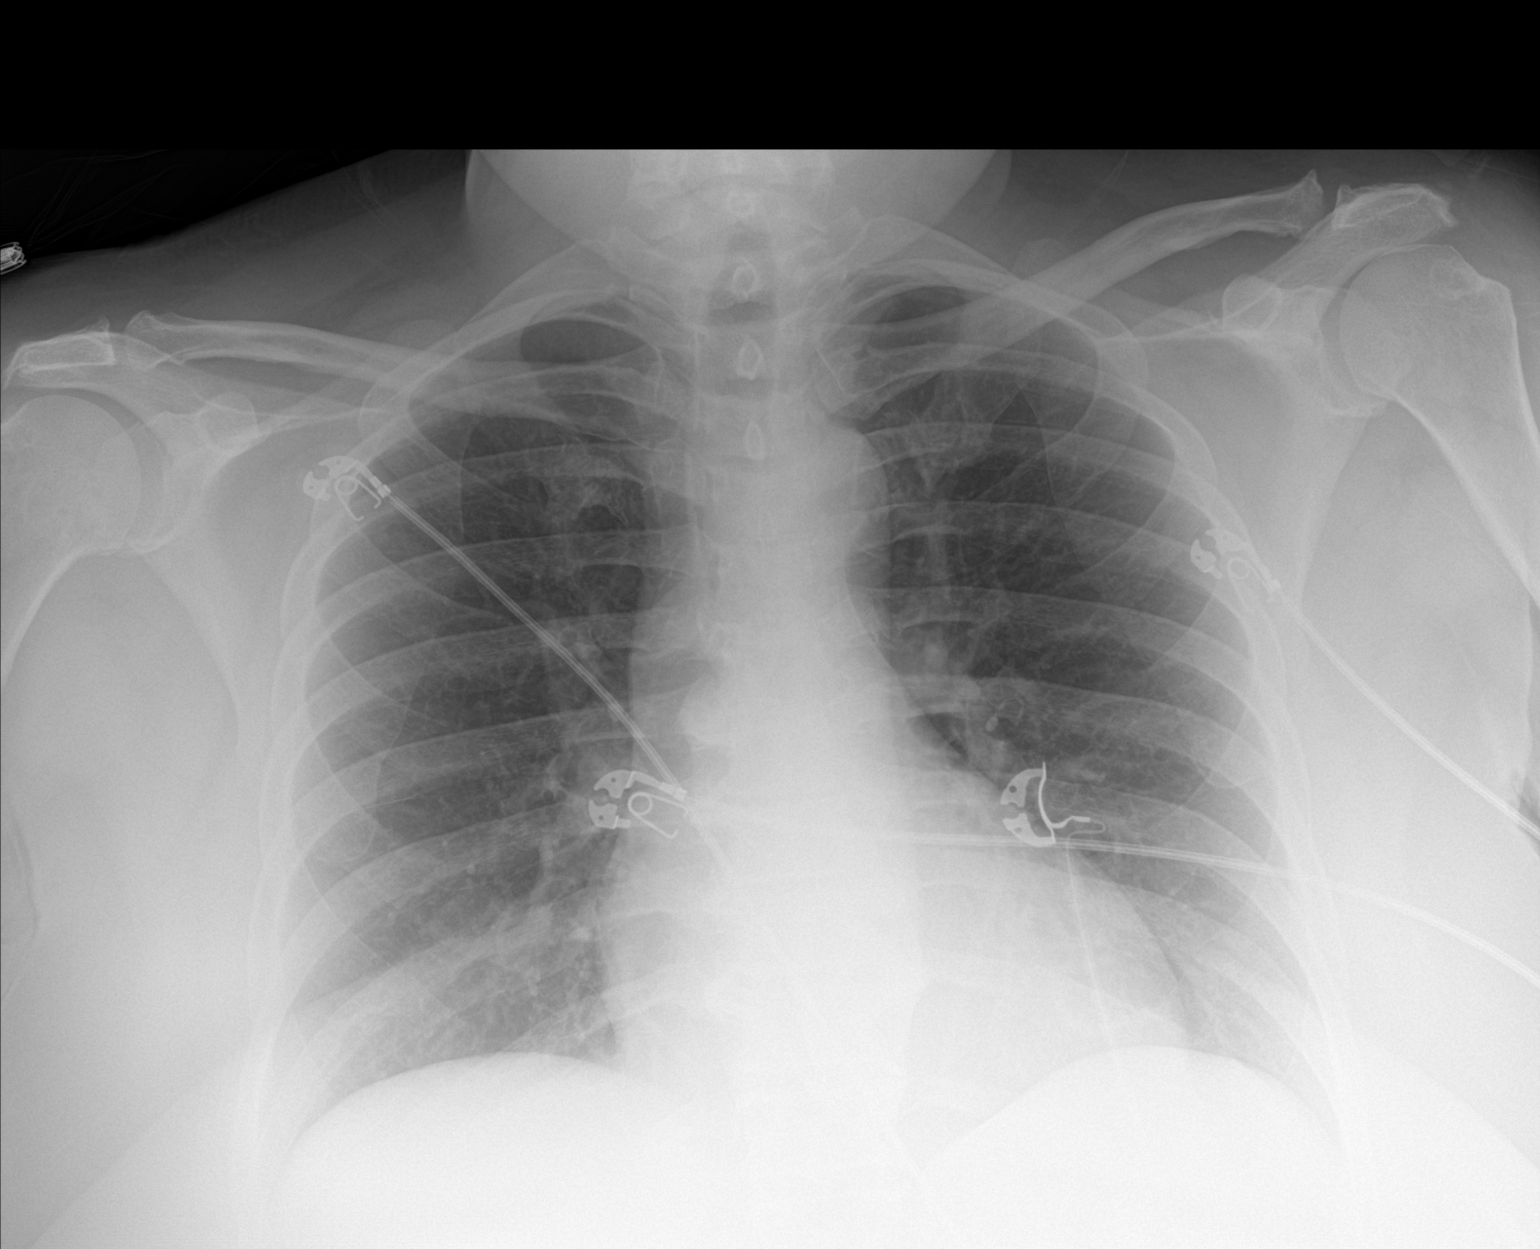

[1 of 1 positions shown; findings below may reference images not displayed]

FINDINGS: Stable cardiomediastinal contours with normal heart size. The lungs
are clear. No pneumothorax or large pleural effusion. No acute
finding in the visualized skeleton.
IMPRESSION: No evidence of active disease in the chest.

## 2022-01-01 ENCOUNTER — Telehealth: Payer: Self-pay

## 2022-01-01 NOTE — Telephone Encounter (Signed)
Patient appearing on report for [facility] - Hypertension Control report due to last documented ambulatory blood pressure of 159/110 mmHg on 04/29/2022. Next appointment with PCP is not scheduled.    Outreached patient to discuss hypertension control and medication management. However, number is no longer in service.   Valeda Malm, Pharm.D. PGY-2 Ambulatory Care Pharmacy Resident 01/01/2022 3:59 PM

## 2022-03-02 IMAGING — CR DG HIP (WITH OR WITHOUT PELVIS) 2-3V*L*
3 series · 3 of 3 positions shown · non-contrast
Comparison: None.

CLINICAL DATA: Left buttock pain

EXAM:
DG HIP (WITH OR WITHOUT PELVIS) 2-3V LEFT

[t pelvis ap]
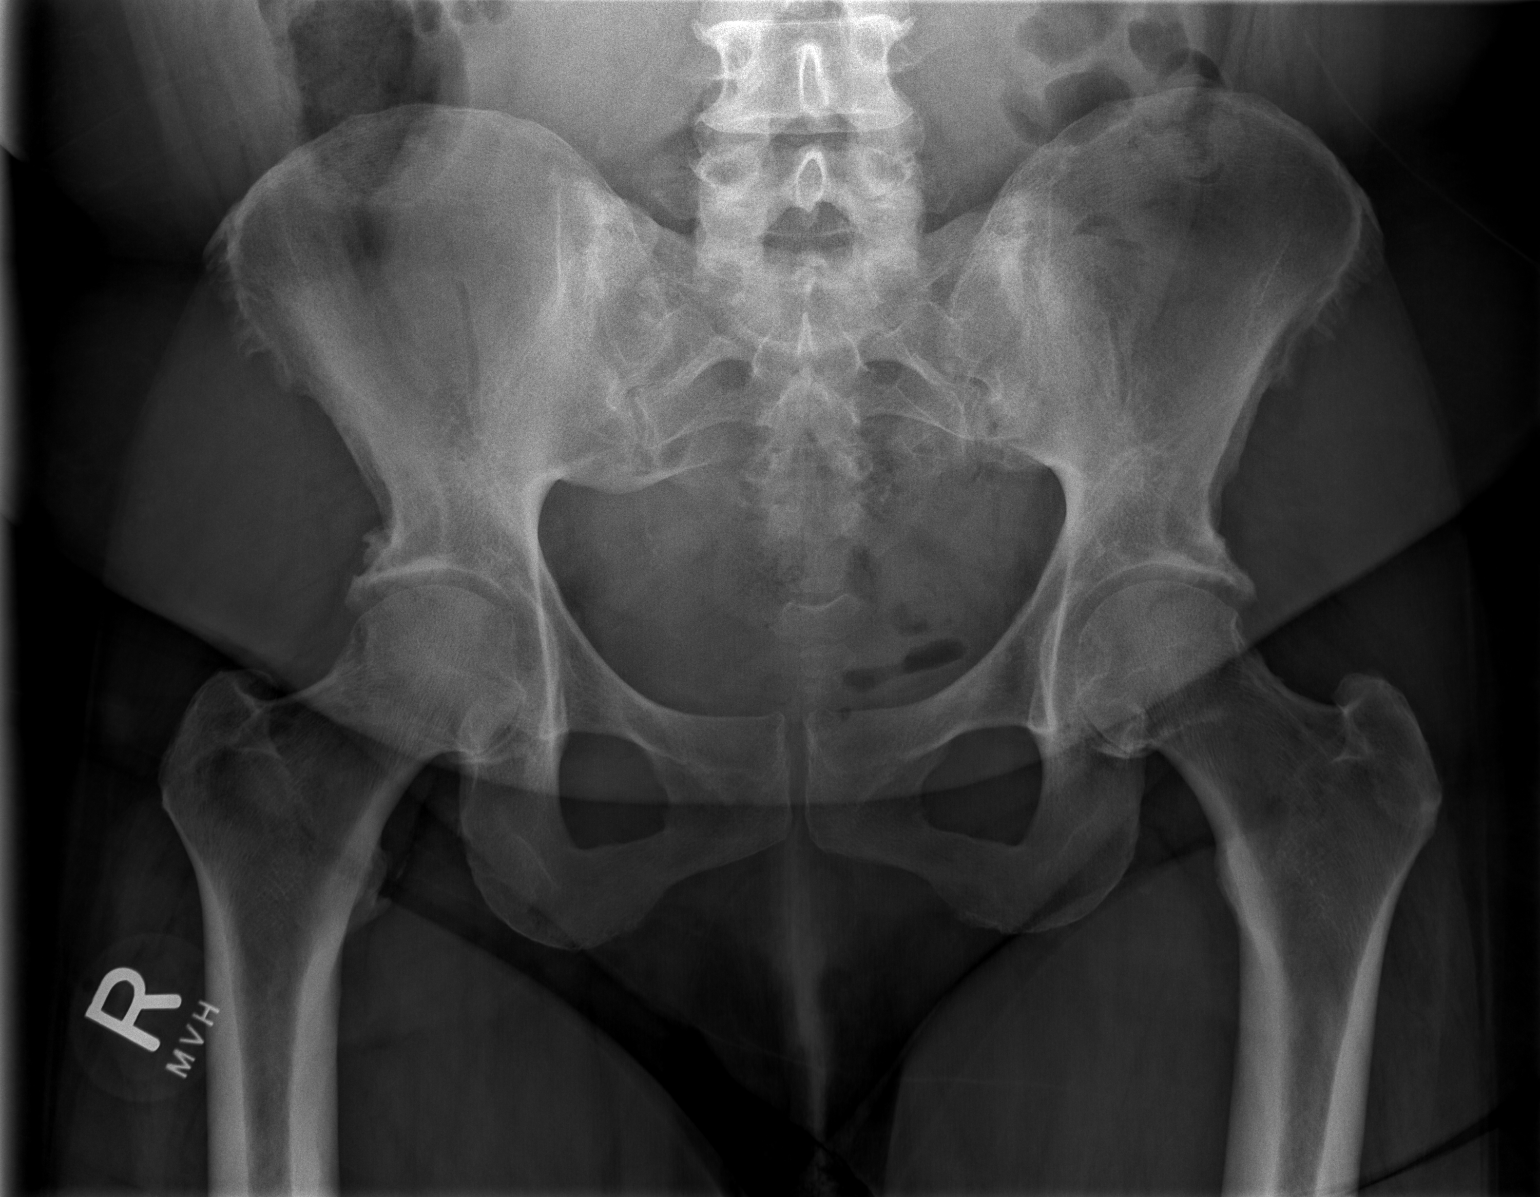

[t hip ap left]
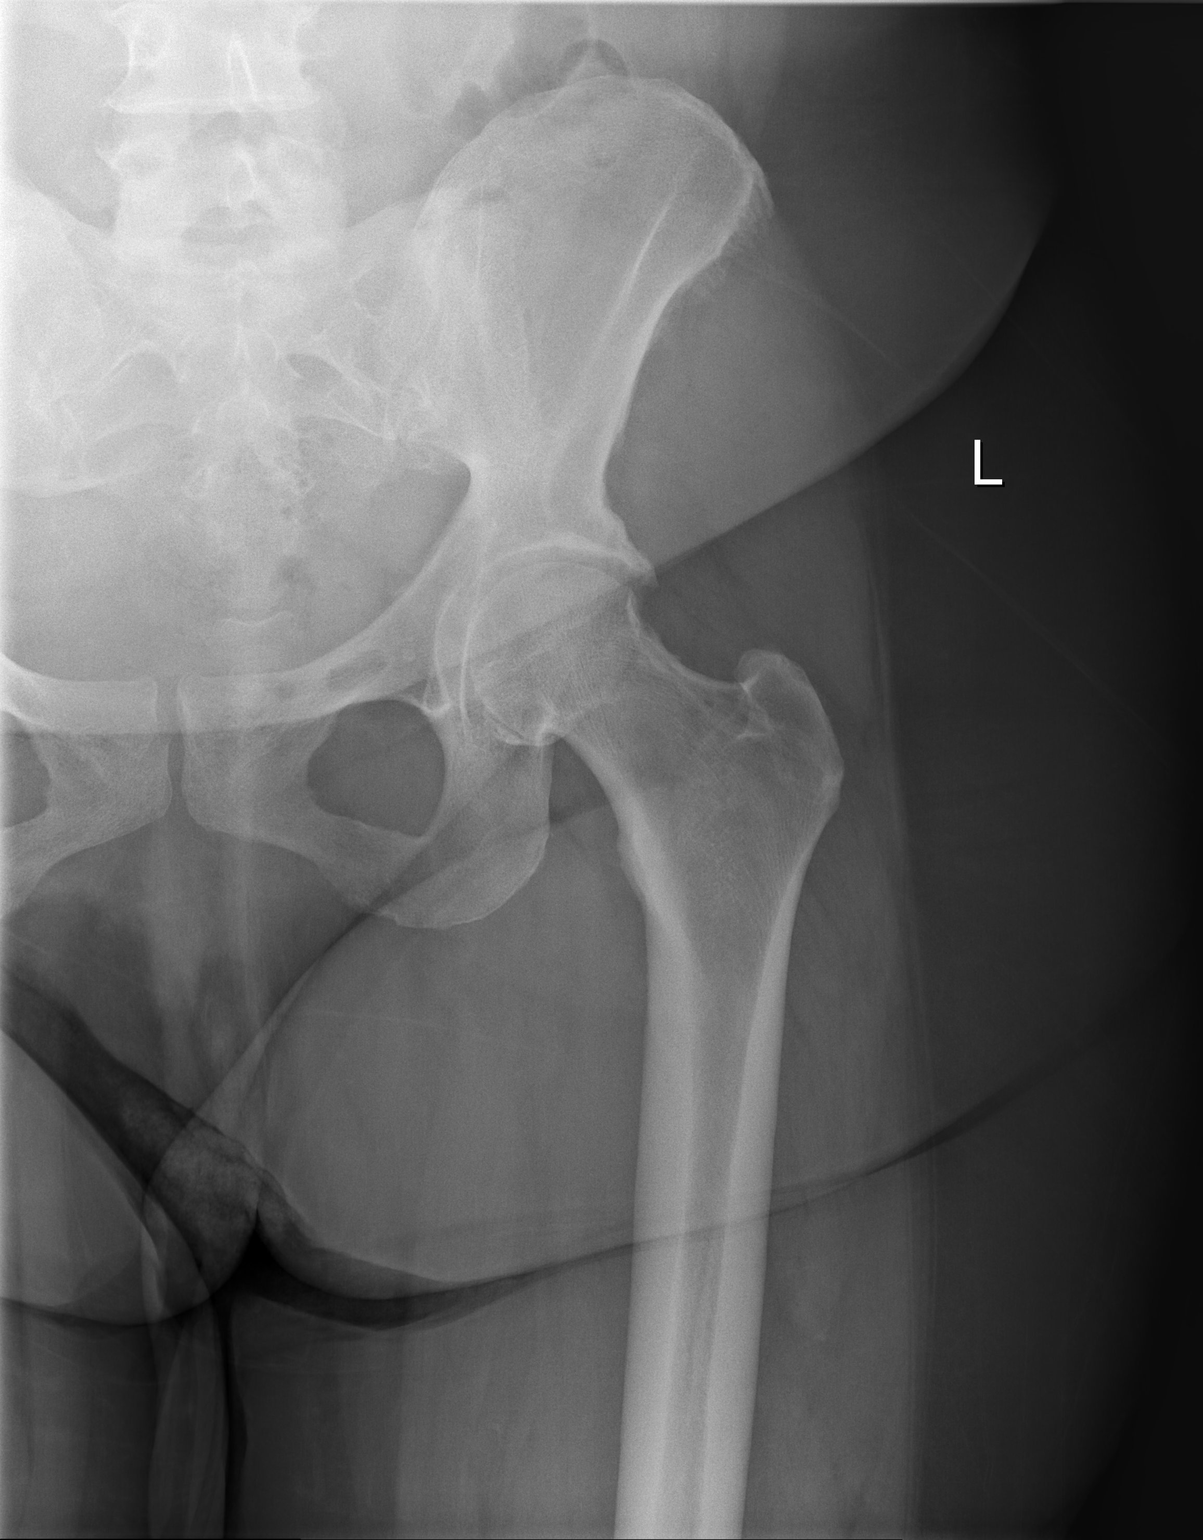

[t hip frog leg left]
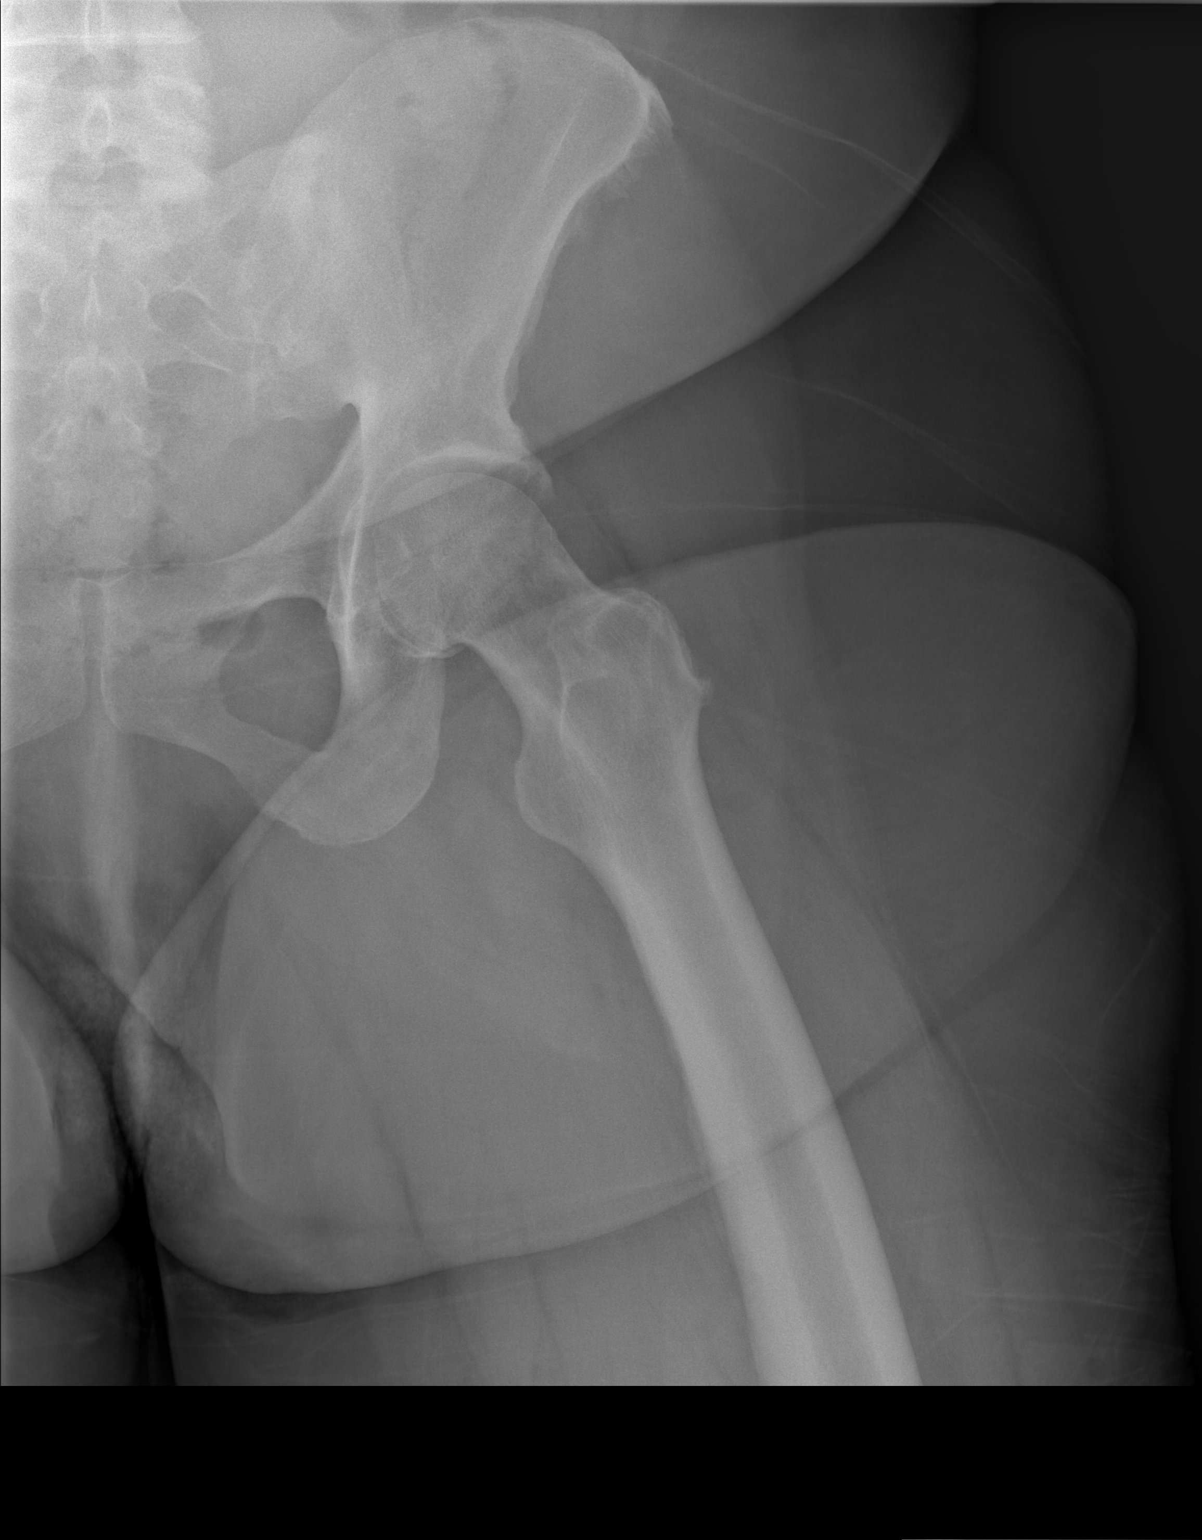

[3 of 3 positions shown; findings below may reference images not displayed]

FINDINGS: Moderate degenerative changes in the hips bilaterally with joint
space narrowing and spurring. SI joints symmetric and unremarkable.
No acute bony abnormality. Specifically, no fracture, subluxation,
or dislocation.
IMPRESSION: Degenerative changes in the hips.  No acute bony abnormality.

## 2022-05-13 ENCOUNTER — Other Ambulatory Visit: Payer: Self-pay

## 2022-05-13 ENCOUNTER — Other Ambulatory Visit: Payer: Self-pay | Admitting: Nurse Practitioner

## 2022-05-13 DIAGNOSIS — I10 Essential (primary) hypertension: Secondary | ICD-10-CM

## 2022-06-02 ENCOUNTER — Other Ambulatory Visit: Payer: Self-pay

## 2022-06-27 ENCOUNTER — Other Ambulatory Visit: Payer: Self-pay | Admitting: Nurse Practitioner

## 2022-06-27 ENCOUNTER — Encounter: Payer: Self-pay | Admitting: Nurse Practitioner

## 2022-06-27 ENCOUNTER — Ambulatory Visit: Payer: PRIVATE HEALTH INSURANCE | Attending: Nurse Practitioner | Admitting: Nurse Practitioner

## 2022-06-27 VITALS — BP 171/99 | HR 83 | Ht 63.0 in | Wt 278.6 lb

## 2022-06-27 DIAGNOSIS — Z23 Encounter for immunization: Secondary | ICD-10-CM

## 2022-06-27 DIAGNOSIS — E559 Vitamin D deficiency, unspecified: Secondary | ICD-10-CM | POA: Diagnosis not present

## 2022-06-27 DIAGNOSIS — Z6841 Body Mass Index (BMI) 40.0 and over, adult: Secondary | ICD-10-CM | POA: Diagnosis not present

## 2022-06-27 DIAGNOSIS — E782 Mixed hyperlipidemia: Secondary | ICD-10-CM | POA: Diagnosis not present

## 2022-06-27 DIAGNOSIS — D72829 Elevated white blood cell count, unspecified: Secondary | ICD-10-CM

## 2022-06-27 DIAGNOSIS — Z1211 Encounter for screening for malignant neoplasm of colon: Secondary | ICD-10-CM

## 2022-06-27 DIAGNOSIS — Z1231 Encounter for screening mammogram for malignant neoplasm of breast: Secondary | ICD-10-CM

## 2022-06-27 DIAGNOSIS — I10 Essential (primary) hypertension: Secondary | ICD-10-CM | POA: Diagnosis not present

## 2022-06-27 MED ORDER — VITAMIN D3 25 MCG (1000 UT) PO CAPS: 1000.0000 [IU] | ORAL_CAPSULE | Freq: Every day | ORAL | 1 refills | Status: DC

## 2022-06-27 MED ORDER — BLOOD PRESSURE MONITOR DEVI: 0 refills | Status: DC

## 2022-06-27 MED ORDER — VALSARTAN 40 MG PO TABS: 40.0000 mg | ORAL_TABLET | Freq: Every day | ORAL | 3 refills | Status: DC

## 2022-06-27 MED ORDER — ATORVASTATIN CALCIUM 10 MG PO TABS: 10.0000 mg | ORAL_TABLET | Freq: Every day | ORAL | 3 refills | Status: DC

## 2022-06-27 NOTE — Progress Notes (Signed)
Assessment & Plan:  Kaitlyn Robertson was seen today for hypertension.  Diagnoses and all orders for this visit:  Primary hypertension Started today-     valsartan (DIOVAN) 40 MG tablet; Take 1 tablet (40 mg total) by mouth daily. -     CMP14+EGFR Sent to [place]; Please provide patient with insurance approved blood pressure monitor. I10.0 Continue all antihypertensives as prescribed.  Reminded to bring in blood pressure log for follow  up appointment.  RECOMMENDATIONS: DASH/Mediterranean Diets are healthier choices for HTN.    Need for shingles vaccine -     Varicella-zoster vaccine IM  Colon cancer screening -     Cologuard  Vitamin D deficiency disease -     VITAMIN D 25 Hydroxy (Vit-D Deficiency, Fractures)  Breast cancer screening by mammogram -     MM 3D SCREEN BREAST BILATERAL; Future  Mixed hyperlipidemia Has not been taking cholesterol medication-     Lipid panel -     atorvastatin (LIPITOR) 10 MG tablet; Take 1 tablet (10 mg total) by mouth daily.  Leukocytosis, unspecified type -     CBC with Differential  Morbid obesity with BMI of 45.0-49.9, adult (HCC) Discussed diet and exercise for person with BMI >49. Instructions to patient: You must burn more calories than you eat. Losing 5 percent of your body weight should be considered a success. In the longer term, losing more than 15 percent of your body weight and staying at this weight is an extremely good result. However, keep in mind that even losing 5 percent of your body weight leads to important health benefits, so try not to get discouraged if you're not able to lose more than this.   Patient has been counseled on age-appropriate routine health concerns for screening and prevention. These are reviewed and up-to-date. Referrals have been placed accordingly. Immunizations are up-to-date or declined.    Subjective:   Chief Complaint  Patient presents with   Hypertension    HPI Kaitlyn Robertson 57 y.o. female presents to office today for follow up.  She is a patient of Dr. Durenda Age who has not been seen in this office for quite some time (04-29-2021)  Patient has been counseled on age-appropriate routine health concerns for screening and prevention. These are reviewed and up-to-date. Referrals have been placed accordingly. Immunizations are up-to-date or declined.     Mammogram: Referral placed today Colonoscopy: Declines referral to gastroenterologist.  Prefers Cologuard which was ordered today. Pap smear: Overdue.  Will need to schedule with primary care   Patient with history of HTN, obesity, tob dep, anxiety/dep, GERD, OA hips, sciatica, vitiligo  Reports being on HCTZ but caused low K+ and skin discoloration (vitiligo)   Blood pressure is poorly controlled today.  At this time we will start valsartan 40 mg daily.  She does not monitor her blood pressure at home as she does not have a monitoring device. BP Readings from Last 3 Encounters:  06/27/22 (!) 171/99  04/29/21 (!) 159/110  08/29/19 134/77     Review of Systems  Constitutional:  Negative for fever, malaise/fatigue and weight loss.  HENT: Negative.  Negative for nosebleeds.   Eyes: Negative.  Negative for blurred vision, double vision and photophobia.  Respiratory: Negative.  Negative for cough and shortness of breath.   Cardiovascular: Negative.  Negative for chest pain, palpitations and leg swelling.  Gastrointestinal: Negative.  Negative for heartburn, nausea and vomiting.  Musculoskeletal: Negative.  Negative for myalgias.  Neurological: Negative.  Negative for dizziness, focal weakness, seizures and headaches.  Psychiatric/Behavioral: Negative.  Negative for suicidal ideas.     Past Medical History:  Diagnosis Date   Anxiety    GERD (gastroesophageal reflux disease)    Hypertension    Sciatica     Past Surgical History:  Procedure Laterality Date   CESAREAN SECTION       Family History  Problem Relation Age of Onset   Hypertension Mother    Hypertension Father     Social History Reviewed with no changes to be made today.   Outpatient Medications Prior to Visit  Medication Sig Dispense Refill   Cholecalciferol (VITAMIN D3) 25 MCG (1000 UT) CAPS Take 1 capsule (1,000 Units total) by mouth daily. 100 capsule 1   atorvastatin (LIPITOR) 10 MG tablet Take 1 tablet (10 mg total) by mouth daily. 30 tablet 3   lisinopril (ZESTRIL) 10 MG tablet Take 1 tablet (10 mg total) by mouth daily. 30 tablet 3   meloxicam (MOBIC) 7.5 MG tablet Take 1 tablet (7.5 mg total) by mouth daily. 30 tablet 6   Multiple Vitamin (MULTIVITAMIN ADULT) TABS Take 1 tablet by mouth daily.     No facility-administered medications prior to visit.    Allergies  Allergen Reactions   Citrus Hives       Objective:    BP (!) 171/99   Pulse 83   Ht 5\' 3"  (1.6 m)   Wt 278 lb 9.6 oz (126.4 kg)   LMP 08/11/2019 (Approximate) Comment: tubaligation   SpO2 95%   BMI 49.35 kg/m  Wt Readings from Last 3 Encounters:  06/27/22 278 lb 9.6 oz (126.4 kg)  04/29/21 276 lb 9.6 oz (125.5 kg)  08/29/19 250 lb (113.4 kg)    Physical Exam Vitals and nursing note reviewed.  Constitutional:      Appearance: She is well-developed. She is obese.  HENT:     Head: Normocephalic and atraumatic.  Cardiovascular:     Rate and Rhythm: Normal rate and regular rhythm.     Heart sounds: Normal heart sounds. No murmur heard.    No friction rub. No gallop.  Pulmonary:     Effort: Pulmonary effort is normal. No tachypnea or respiratory distress.     Breath sounds: Normal breath sounds. No decreased breath sounds, wheezing, rhonchi or rales.  Chest:     Chest wall: No tenderness.  Abdominal:     General: Bowel sounds are normal.     Palpations: Abdomen is soft.  Musculoskeletal:        General: Normal range of motion.     Cervical back: Normal range of motion.  Skin:    General: Skin is warm  and dry.  Neurological:     Mental Status: She is alert and oriented to person, place, and time.     Coordination: Coordination normal.  Psychiatric:        Behavior: Behavior normal. Behavior is cooperative.        Thought Content: Thought content normal.        Judgment: Judgment normal.          Patient has been counseled extensively about nutrition and exercise as well as the importance of adherence with medications and regular follow-up. The patient was given clear instructions to go to ER or return to medical center if symptoms don't improve, worsen or new problems develop. The patient verbalized understanding.   Follow-up: Return in about 4 weeks (  around 07/25/2022) for 4 weeks BMP and LUKE for BP check. See Dr Wynetta Emery in 3 months.   Gildardo Pounds, FNP-BC [facility] [place], [place]   06/27/2022, 12:44 PM

## 2022-06-27 NOTE — Telephone Encounter (Signed)
Medication Refill - Medication: Cholecalciferol (VITAMIN D3) 25 MCG (1000 UT) CAPS  atorvastatin (LIPITOR) 10 MG tablet    Pt states she is at the pharmacy and that these medications are missing following her appt today. Please advise   Wants a call back so she can know if she needs to wait or if she is able to continue all of her medications together   Has the patient contacted their pharmacy? Yes.   (Agent: If no, request that the patient contact the pharmacy for the refill. If patient does not wish to contact the pharmacy document the reason why and proceed with request.) (Agent: If yes, when and what did the pharmacy advise?)  Preferred Pharmacy (with phone number or street name):  [facility] [phone] - [place], [address] [place] [address] AT [address]  [place] [facility] [address]  Phone: [phone] Fax: [phone]   Has the patient been seen for an appointment in the last year OR does the patient have an upcoming appointment? Yes.    Agent: Please be advised that RX refills may take up to 3 business days. We ask that you follow-up with your pharmacy.

## 2022-06-27 NOTE — Telephone Encounter (Signed)
Patient called and advised atorvastatin was sent to the pharmacy today and shows received, vitamin D will have to be sent over. Advised Vitamin D can be purchased OTC and the pharmacist may tell her that based on insurance, she verbalized understanding.

## 2022-06-27 NOTE — Patient Instructions (Addendum)
[facility]  Located in: [facility] Address: [address], [place], [place] [address] Phone: [phone]

## 2022-06-27 NOTE — Telephone Encounter (Signed)
Requested medication (s) are due for refill today: Yes  Requested medication (s) are on the active medication list: Yes  Last refill:  05/01/21  Future visit scheduled: Yes  Notes to clinic:  Unable to refill per protocol due to failed labs, no updated results. Patient had OV today and thought this would be sent in to [facility] with her other medications.     Requested Prescriptions  Pending Prescriptions Disp Refills   Cholecalciferol (VITAMIN D3) 25 MCG (1000 UT) CAPS 100 capsule 1    Sig: Take 1 capsule (1,000 Units total) by mouth daily.     Endocrinology:  Vitamins - Vitamin D Supplementation 2 Failed - 06/27/2022 12:57 PM      Failed - Manual Review: Route requests for 50,000 IU strength to the provider      Failed - Ca in normal range and within 360 days    Calcium  Date Value Ref Range Status  03/01/2021 9.4 8.7 - 10.2 mg/dL Final         Failed - Vitamin D in normal range and within 360 days    Vit D, 25-Hydroxy  Date Value Ref Range Status  04/29/2021 17.8 (L) 30.0 - 100.0 ng/mL Final    Comment:    Vitamin D deficiency has been defined by the Institute of Medicine and an Endocrine Society practice guideline as a level of serum 25-OH vitamin D less than 20 ng/mL (1,2). The Endocrine Society went on to further define vitamin D insufficiency as a level between 21 and 29 ng/mL (2). 1. IOM (Institute of Medicine). 2010. Dietary reference    intakes for calcium and D. [place]: The    [employer]. 2. Holick MF, Binkley Chaves, Bischoff-Ferrari HA, et al.    Evaluation, treatment, and prevention of vitamin D    deficiency: an Endocrine Society clinical practice    guideline. JCEM. 2011 Jul; 96(7):1911-30.          Passed - Valid encounter within last 12 months    Recent Outpatient Visits           Today Primary hypertension   [place], Vernia Buff, NP   1 year ago Essential hypertension   [place], Deborah B, MD   1 year ago Essential hypertension   [place], MD       Future Appointments             In 4 weeks Daisy Blossom, Jarome Matin, [place]   In 3 months Wynetta Emery, Dalbert Batman, MD [place]

## 2022-06-28 LAB — CMP14+EGFR
ALT: 22 IU/L (ref 0–32)
AST: 25 IU/L (ref 0–40)
Albumin/Globulin Ratio: 1.4 (ref 1.2–2.2)
Albumin: 4 g/dL (ref 3.8–4.9)
Alkaline Phosphatase: 94 IU/L (ref 44–121)
BUN/Creatinine Ratio: 7 — ABNORMAL LOW (ref 9–23)
BUN: 6 mg/dL (ref 6–24)
Bilirubin Total: 0.2 mg/dL (ref 0.0–1.2)
CO2: 23 mmol/L (ref 20–29)
Calcium: 8.9 mg/dL (ref 8.7–10.2)
Chloride: 106 mmol/L (ref 96–106)
Creatinine, Ser: 0.83 mg/dL (ref 0.57–1.00)
Globulin, Total: 2.8 g/dL (ref 1.5–4.5)
Glucose: 91 mg/dL (ref 70–99)
Potassium: 3.9 mmol/L (ref 3.5–5.2)
Sodium: 142 mmol/L (ref 134–144)
Total Protein: 6.8 g/dL (ref 6.0–8.5)
eGFR: 83 mL/min/{1.73_m2} (ref >59)

## 2022-06-28 LAB — LIPID PANEL
Chol/HDL Ratio: 5.7 ratio — ABNORMAL HIGH (ref 0.0–4.4)
Cholesterol, Total: 164 mg/dL (ref 100–199)
HDL: 29 mg/dL — ABNORMAL LOW (ref >39)
LDL Chol Calc (NIH): 114 mg/dL — ABNORMAL HIGH (ref 0–99)
Triglycerides: 116 mg/dL (ref 0–149)
VLDL Cholesterol Cal: 21 mg/dL (ref 5–40)

## 2022-06-28 LAB — CBC WITH DIFFERENTIAL/PLATELET
Basophils Absolute: 0 10*3/uL (ref 0.0–0.2)
Basos: 0 %
EOS (ABSOLUTE): 0.1 10*3/uL (ref 0.0–0.4)
Eos: 1 %
Hematocrit: 43.9 % (ref 34.0–46.6)
Hemoglobin: 14.1 g/dL (ref 11.1–15.9)
Immature Grans (Abs): 0 10*3/uL (ref 0.0–0.1)
Immature Granulocytes: 0 %
Lymphocytes Absolute: 3.1 10*3/uL (ref 0.7–3.1)
Lymphs: 34 %
MCH: 24.6 pg — ABNORMAL LOW (ref 26.6–33.0)
MCHC: 32.1 g/dL (ref 31.5–35.7)
MCV: 77 fL — ABNORMAL LOW (ref 79–97)
Monocytes Absolute: 0.4 10*3/uL (ref 0.1–0.9)
Monocytes: 5 %
Neutrophils Absolute: 5.4 10*3/uL (ref 1.4–7.0)
Neutrophils: 60 %
Platelets: 217 10*3/uL (ref 150–450)
RBC: 5.73 x10E6/uL — ABNORMAL HIGH (ref 3.77–5.28)
RDW: 15.1 % (ref 11.7–15.4)
WBC: 9 10*3/uL (ref 3.4–10.8)

## 2022-06-28 LAB — VITAMIN D 25 HYDROXY (VIT D DEFICIENCY, FRACTURES): Vit D, 25-Hydroxy: 18.7 ng/mL — ABNORMAL LOW (ref 30.0–100.0)

## 2022-07-06 ENCOUNTER — Other Ambulatory Visit: Payer: Self-pay | Admitting: Nurse Practitioner

## 2022-07-06 DIAGNOSIS — E559 Vitamin D deficiency, unspecified: Secondary | ICD-10-CM

## 2022-07-06 MED ORDER — VITAMIN D (ERGOCALCIFEROL) 1.25 MG (50000 UNIT) PO CAPS: 50000.0000 [IU] | ORAL_CAPSULE | ORAL | 1 refills | Status: DC

## 2022-07-25 ENCOUNTER — Encounter: Payer: Self-pay | Admitting: Pharmacist

## 2022-07-25 ENCOUNTER — Ambulatory Visit: Payer: Medicaid Other | Attending: Internal Medicine | Admitting: Pharmacist

## 2022-07-25 VITALS — BP 130/85 | HR 72

## 2022-07-25 DIAGNOSIS — I10 Essential (primary) hypertension: Secondary | ICD-10-CM | POA: Diagnosis not present

## 2022-07-25 NOTE — Progress Notes (Signed)
S:     No chief complaint on file.  57 y.o. female who presents for hypertension evaluation, education, and management.  PMH is significant for HTN, chronic lower back pain, vitiligo, obesity, tobacco dependence, anxiety, and HLD. Patient was referred and last seen by Primary Care Provider, Geryl Rankins, on 06/27/2022. BP at that visit was 171/99 mmHg.    At her visit with Zelda, it was noted that the patient is under the care of PCP, Dr. Wynetta Emery and has not been seen by her in some time. Valsartan was started at that visit.   Today, patient arrives in good spirits and presents without assistance. Denies dizziness, headache, blurred vision, swelling.   Patient reports hypertension is longstanding.   Family/Social history:  Fhx: HTN Tobacco: some day cigarette smoker Alcohol: none reported   Medication adherence reported. Patient has taken BP medications today.   Current antihypertensives include: valsartan 40 mg daily  Antihypertensives tried in the past include: lisinopril, HCTZ (low K)  Reported home BP readings: none  Patient reported dietary habits:  -Limits her sodium. Does not add salt at the table. -Cooks with Mrs. Deliah Boston -Denies excessive intake of caffeine. She will drink some soda during the day but drinks mostly water.   Patient-reported exercise habits:  -Active with her grandchildren.  -After school, she'll take her children to a park area and walk around the park while they play.  -Does this ~3 days out of her week  O:  Vitals:   07/25/22 0939  BP: 130/85  Pulse: 72   Last 3 Office BP readings: BP Readings from Last 3 Encounters:  07/25/22 130/85  06/27/22 (!) 171/99  04/29/21 (!) 159/110   BMET    Component Value Date/Time   NA 142 06/27/2022 1110   K 3.9 06/27/2022 1110   CL 106 06/27/2022 1110   CO2 23 06/27/2022 1110   GLUCOSE 91 06/27/2022 1110   GLUCOSE 104 (H) 05/29/2019 0809   BUN 6 06/27/2022 1110   CREATININE 0.83 06/27/2022 1110    CALCIUM 8.9 06/27/2022 1110   GFRNONAA >60 05/29/2019 0809   GFRAA >60 05/29/2019 0809    Renal function: CrCl cannot be calculated (Patient's most recent lab result is older than the maximum 21 days allowed.).  Clinical ASCVD: No  The 10-year ASCVD risk score (Arnett DK, et al., 2019) is: 15.3%   Values used to calculate the score:     Age: 2 years     Sex: Female     Is Non-Hispanic African American: Yes     Diabetic: No     Tobacco smoker: Yes     Systolic Blood Pressure: [id number]     Is BP treated: Yes     HDL Cholesterol: 29 mg/dL     Total Cholesterol: 164 mg/dL  Patient is participating in a Managed Medicaid Plan:  Yes    A/P: Hypertension longstanding currently above goal but much improved on current medications. BP goal < 130/80 mmHg. Medication adherence appears appropriate. I think her BP has been so elevated with her having a gap in visits, however, I commended her for her medication adherence and for following-up with me. We will continue valsartan and get labs today. I'll have her follow-up with me in 1 month for a recheck.   -Continued valsartan 40 mg daily.  -Patient educated on purpose, proper use, and potential adverse effects of valsartan.  -F/u labs ordered - BMP8+eGFR -Counseled on lifestyle modifications for blood pressure control including reduced  dietary sodium, increased exercise, adequate sleep. -Encouraged patient to check BP at home and bring log of readings to next visit. Counseled on proper use of home BP cuff.   Results reviewed and written information provided.    Written patient instructions provided. Patient verbalized understanding of treatment plan.  Total time in face to face counseling 30 minutes.    Follow-up:  Pharmacist in 1 month. PCP clinic visit in May.   Benard Halsted, PharmD, [place], [place] [phone]

## 2022-07-26 LAB — BMP8+EGFR
BUN/Creatinine Ratio: 10 (ref 9–23)
BUN: 9 mg/dL (ref 6–24)
CO2: 20 mmol/L (ref 20–29)
Calcium: 9.2 mg/dL (ref 8.7–10.2)
Chloride: 107 mmol/L — ABNORMAL HIGH (ref 96–106)
Creatinine, Ser: 0.86 mg/dL (ref 0.57–1.00)
Glucose: 112 mg/dL — ABNORMAL HIGH (ref 70–99)
Potassium: 4.3 mmol/L (ref 3.5–5.2)
Sodium: 144 mmol/L (ref 134–144)
eGFR: 80 mL/min/{1.73_m2} (ref >59)

## 2022-08-04 ENCOUNTER — Ambulatory Visit
Admission: RE | Admit: 2022-08-04 | Discharge: 2022-08-04 | Disposition: A | Discharge status: Home / Self Care | Payer: Medicaid Other | Source: Ambulatory Visit | Attending: Nurse Practitioner | Admitting: Nurse Practitioner

## 2022-08-04 DIAGNOSIS — Z1231 Encounter for screening mammogram for malignant neoplasm of breast: Secondary | ICD-10-CM | POA: Diagnosis not present

## 2022-08-06 ENCOUNTER — Other Ambulatory Visit: Payer: Self-pay | Admitting: Nurse Practitioner

## 2022-08-06 DIAGNOSIS — R928 Other abnormal and inconclusive findings on diagnostic imaging of breast: Secondary | ICD-10-CM

## 2022-08-15 ENCOUNTER — Ambulatory Visit: Payer: Self-pay

## 2022-08-15 NOTE — Telephone Encounter (Signed)
  Chief Complaint: Medication question Symptoms: Mild URI s/s cough, congestion Frequency:  Pertinent Negatives: Patient denies  Disposition: [] ED /[] Urgent Care (no appt availability in office) / [] Appointment(In office/virtual)/ []  [facility]/ [x] Home Care/ [] Refused Recommended Disposition /[] [place] [facility]/ []  Follow-up with PCP Additional Notes: PT called to make sure she can take OTC cold medications. Pt has chosen medications formulated for pt with HTN.  PT will be careful not to "double dose" IBU or Tylenol while using.  PT is using vicks vapor rub and will use humidifier for S/s Pt will call back or consult pharmacist if needed.    Summary: Questions regarding rx and otc meds   Patient is currently taking valsartan (DIOVAN) 40 MG tablet . She has questions regarding what cold medication she can take. Mucinex DM and Coricidans she currently has.      Reason for Disposition  Caller has medicine question, adult has minor symptoms, caller declines triage, AND triager answers question  Answer Assessment - Initial Assessment Questions 1. NAME of MEDICINE: "What medicine(s) are you calling about?"     Cold medicine - OTC 2. QUESTION: "What is your question?" (e.g., double dose of medicine, side effect)     Can pt take mucinex Dm or coricidan? 3. PRESCRIBER: "Who prescribed the medicine?" Reason: if prescribed by specialist, call should be referred to that group.     OTC 4. SYMPTOMS: "Do you have any symptoms?" If Yes, ask: "What symptoms are you having?"  "How bad are the symptoms (e.g., mild, moderate, severe)     URI  Protocols used: Medication Question Call-A-AH

## 2022-08-23 ENCOUNTER — Ambulatory Visit
Admission: RE | Admit: 2022-08-23 | Discharge: 2022-08-23 | Disposition: A | Discharge status: Home / Self Care | Payer: Medicaid Other | Source: Ambulatory Visit | Attending: Nurse Practitioner | Admitting: Nurse Practitioner

## 2022-08-23 DIAGNOSIS — R928 Other abnormal and inconclusive findings on diagnostic imaging of breast: Secondary | ICD-10-CM

## 2022-08-23 DIAGNOSIS — N6311 Unspecified lump in the right breast, upper outer quadrant: Secondary | ICD-10-CM | POA: Diagnosis not present

## 2022-08-28 ENCOUNTER — Other Ambulatory Visit: Payer: Self-pay | Admitting: Nurse Practitioner

## 2022-08-28 DIAGNOSIS — I10 Essential (primary) hypertension: Secondary | ICD-10-CM

## 2022-08-28 MED ORDER — BLOOD PRESSURE MONITOR DEVI
0 refills | Status: AC
Start: 2022-08-28 — End: ?

## 2022-09-01 ENCOUNTER — Encounter: Payer: Self-pay | Admitting: Pharmacist

## 2022-09-01 ENCOUNTER — Ambulatory Visit: Payer: Medicaid Other | Attending: Internal Medicine | Admitting: Pharmacist

## 2022-09-01 VITALS — BP 150/86 | HR 84

## 2022-09-01 DIAGNOSIS — I10 Essential (primary) hypertension: Secondary | ICD-10-CM | POA: Diagnosis not present

## 2022-09-01 NOTE — Progress Notes (Signed)
S:    PCP: Dr. Laural Benes  57 y.o. female who presents for hypertension evaluation, education, and management. PMH is significant for HTN, chronic lower back pain, vitiligo, obesity, tobacco dependence, anxiety, and HLD. Patient was referred by Bertram Denver, on 06/27/2022. BP at that visit was 171/99 mmHg. Last seen by pharmacy clinic on 07/25/2022 where BP was 130/85 and labs were stable.   Today, patient arrives in good spirits and presents without assistance. Denies dizziness, headache, blurred vision, swelling. BP elevated in clinic x2, 166/98 mmHg, 150/86 mmHg upon recheck. She does not check her BP at home. I recommended to increase her valsartan; however, patient declined stating that she is agitated about personal stuff. Patient declined to discuss further.   Patient reports hypertension is longstanding.   Family/Social history:  Fhx: HTN Tobacco: some day cigarette smoker Alcohol: none reported   Medication adherence reported. Patient has taken BP medications today.   Current antihypertensives include: valsartan 40 mg daily  Antihypertensives tried in the past include: lisinopril, HCTZ (low K)  Reported home BP readings: none, does not have monitor at home.   Patient reported dietary habits:  -Limits her sodium. Does not add salt at the table. -Cooks with [name] -Denies excessive intake of caffeine. She will drink some soda during the day but drinks mostly water.   Patient-reported exercise habits:  -Active with her grandchildren.  -After school, she'll take her children to a park area and walk around the park while they play.  -Does this ~3 days out of her week  O:  Vitals:   09/01/22 0915 09/01/22 0916  BP: (!) 166/98 (!) 150/86  Pulse:  84    Last 3 Office BP readings: BP Readings from Last 3 Encounters:  09/01/22 (!) 150/86  07/25/22 130/85  06/27/22 (!) 171/99   BMET    Component Value Date/Time   NA 144 07/25/2022 0943   K 4.3 07/25/2022 0943   CL 107  (H) 07/25/2022 0943   CO2 20 07/25/2022 0943   GLUCOSE 112 (H) 07/25/2022 0943   GLUCOSE 104 (H) 05/29/2019 0809   BUN 9 07/25/2022 0943   CREATININE 0.86 07/25/2022 0943   CALCIUM 9.2 07/25/2022 0943   GFRNONAA >60 05/29/2019 0809   GFRAA >60 05/29/2019 0809    Renal function: CrCl cannot be calculated (Patient's most recent lab result is older than the maximum 21 days allowed.).  Clinical ASCVD: No  The 10-year ASCVD risk score (Arnett DK, et al., 2019) is: 24%   Values used to calculate the score:     Age: 16 years     Sex: Female     Is Non-Hispanic African American: Yes     Diabetic: No     Tobacco smoker: Yes     Systolic Blood Pressure: 150 mmHg     Is BP treated: Yes     HDL Cholesterol: 29 mg/dL     Total Cholesterol: 164 mg/dL  Patient is participating in a Managed Medicaid Plan:  Yes   A/P: Hypertension longstanding currently above goal on current medications. BP goal < 130/80 mmHg. Medication adherence appears appropriate. I recommended to increase her valsartan; however, patient declined stating that her BP is elevated in the setting that she is agitated about personal stuff. -Continued valsartan 40 mg daily. Patient declined dose increase.  -Patient educated on purpose, proper use, and potential adverse effects of valsartan.  -Counseled on lifestyle modifications for blood pressure control including reduced dietary sodium, increased exercise, adequate sleep. -  Encouraged patient to check BP at home and bring log of readings to next visit. Counseled on proper use of home BP cuff.   Results reviewed and written information provided.    Written patient instructions provided. Patient verbalized understanding of treatment plan.  Total time in face to face counseling 30 minutes.    Follow-up:  Pharmacist PRN PCP clinic visit in May.   Valeda Malm, Pharm.D. PGY-2 Ambulatory Care Pharmacy Resident

## 2022-09-26 ENCOUNTER — Ambulatory Visit: Payer: Self-pay

## 2022-09-26 NOTE — Telephone Encounter (Signed)
  Chief Complaint: sinus symptoms Symptoms: nasal congestion, cough and congestion Frequency: always had sinus issues Pertinent Negatives: NA Disposition: [] ED /[] Urgent Care (no appt availability in office) / [] Appointment(In office/virtual)/ []  [facility]/ [x] Home Care/ [] Refused Recommended Disposition /[] [place] [facility]/ []  Follow-up with PCP Additional Notes: pt states she has been taking Allegra for her sinuses and it helps but wanted to make sure it was ok to take with her other meds. Pt is on cholesterol and HTN meds. Advised her would be fine as long as not Allegra D since the D can increase BP. Pt is going to start monitoring BP as well but said she has the plain Allegra. No further assistance needed.  Summary: med ?   Pt called in wants to know if its ok to take Allegra D for her sinus issues while she is taking the cholesterol and blood pressure med.         Reason for Disposition  [1] Sinus congestion as part of a cold AND [2] present < 10 days  Answer Assessment - Initial Assessment Questions 2. ONSET: "When did the sinus pain start?"  (e.g., hours, days)      Always had sinus symptoms  3. SEVERITY: "How bad is the pain?"   (Scale 1-10; mild, moderate or severe)   - MILD (1-3): doesn't interfere with normal activities    - MODERATE (4-7): interferes with normal activities (e.g., work or school) or awakens from sleep   - SEVERE (8-10): excruciating pain and patient unable to do any normal activities        mild 5. NASAL CONGESTION: "Is the nose blocked?" If Yes, ask: "Can you open it or must you breathe through your mouth?"     yes 8. OTHER SYMPTOMS: "Do you have any other symptoms?" (e.g., sore throat, cough, earache, difficulty breathing)     Cough and congestion  Protocols used: Sinus Pain or Congestion-A-AH

## 2022-09-29 ENCOUNTER — Telehealth: Payer: Self-pay | Admitting: Pharmacist

## 2022-09-29 NOTE — Progress Notes (Signed)
Patient attempted to be outreached PharmD Candidate on to discuss hypertension. Left voicemail for patient to return our call at their convenience at [phone]  Catie Eppie Gibson, PharmD, BCACP, CPP [facility] [phone]

## 2022-09-30 ENCOUNTER — Ambulatory Visit: Payer: Medicaid Other | Attending: Internal Medicine | Admitting: Internal Medicine

## 2022-09-30 ENCOUNTER — Encounter: Payer: Self-pay | Admitting: Internal Medicine

## 2022-09-30 VITALS — BP 165/90 | HR 84 | Temp 98.3°F | Ht 63.0 in | Wt 279.0 lb

## 2022-09-30 DIAGNOSIS — E559 Vitamin D deficiency, unspecified: Secondary | ICD-10-CM | POA: Diagnosis not present

## 2022-09-30 DIAGNOSIS — R300 Dysuria: Secondary | ICD-10-CM

## 2022-09-30 DIAGNOSIS — Z1331 Encounter for screening for depression: Secondary | ICD-10-CM

## 2022-09-30 DIAGNOSIS — F411 Generalized anxiety disorder: Secondary | ICD-10-CM

## 2022-09-30 DIAGNOSIS — F17200 Nicotine dependence, unspecified, uncomplicated: Secondary | ICD-10-CM | POA: Diagnosis not present

## 2022-09-30 DIAGNOSIS — I10 Essential (primary) hypertension: Secondary | ICD-10-CM | POA: Diagnosis not present

## 2022-09-30 DIAGNOSIS — Z1211 Encounter for screening for malignant neoplasm of colon: Secondary | ICD-10-CM

## 2022-09-30 DIAGNOSIS — N951 Menopausal and female climacteric states: Secondary | ICD-10-CM

## 2022-09-30 DIAGNOSIS — Z23 Encounter for immunization: Secondary | ICD-10-CM | POA: Diagnosis not present

## 2022-09-30 DIAGNOSIS — J069 Acute upper respiratory infection, unspecified: Secondary | ICD-10-CM | POA: Diagnosis not present

## 2022-09-30 DIAGNOSIS — E782 Mixed hyperlipidemia: Secondary | ICD-10-CM | POA: Diagnosis not present

## 2022-09-30 LAB — POCT URINALYSIS DIP (CLINITEK)
Bilirubin, UA: NEGATIVE
Blood, UA: NEGATIVE
Glucose, UA: NEGATIVE mg/dL
Ketones, POC UA: NEGATIVE mg/dL
Leukocytes, UA: NEGATIVE
Nitrite, UA: NEGATIVE
POC PROTEIN,UA: 30 — AB
Spec Grav, UA: 1.02 (ref 1.010–1.025)
Urobilinogen, UA: 0.2 E.U./dL
pH, UA: 5.5 (ref 5.0–8.0)

## 2022-09-30 MED ORDER — VALSARTAN 80 MG PO TABS: 80.0000 mg | ORAL_TABLET | Freq: Every day | ORAL | 1 refills | Status: DC

## 2022-09-30 NOTE — Progress Notes (Signed)
Patient ID: Kaitlyn Robertson, female    DOB: 1965/08/31  MRN: [id number]  CC: Hypertension (Htn f/u. Reports never receiving atorvasting, BP machine, & cholecalciferol/Discomfort when urinating x 2 days /Poss sinus infection, pain on L side of nose & face, runny eyes, sneezing, coughing X5 days/Yes to shingles vax. Yes to pap at another appt.)   Subjective: Kaitlyn Robertson is a 57 y.o. female who presents for chronic ds management.  I last saw her 04/2021 Her concerns today include:  Patient with history of HTN, obesity, tob dep, anxiety/dep, GERD, OA hips, sciatica, Vitilago   Reports of slight discomfort at end of urination x 2 days.  No fever or flank pain.  No hematuria.  Thinks she may have a sinus infection.  Reports pain on the left side of the face below eye associated with lacrimation, sneezing and coughing x 5 days.  No SOB or fever.  Cough productive of yellow mucus only in a.m; clear the rest of the day.  Green mucous from LT nostril one time. Pain below LT eye has decreased.  Thinks she caught cold from her 33 yr old grandson. Taking Mucinex DM and Allegra. COVID test negative 4 days ago  HTN: Patient currently on Diovan 40 mg daily.  She had seen our clinical pharmacist last month.  Blood pressure was not at goal.  She had recommended increasing the Diovan to 80 mg but patient declined feeling that her blood pressure was elevated because she was aggravated at the time. -no device at home to check BP  C/o hot flashes and mood swings.  Hot flashes started 2-3 yrs ago at nights.  Yesterday was the first time she had hot flashes during the day.  Menses stopped 2 yrs ago Reports reaction to Paxil in the past like "thinks was flicking on and off" in her vision and caused her to tremble.   PHQ-9 and GAD-7 are positive.  However she does not feel that depression is a major issue for her at this time.  She feels that she deals with and manages her anxiety well.   She had seen our nurse  practitioner earlier this year.  Vitamin D level was found to be 18.7.  Vitamin D supplement recommended. Taking Vit D 1000 IU daily OTC.   - Tobacco dependence:  smoking less; about 1/3 pk/day.  Use to be 1/2 pk a day.  Smoked 35 yrs.  Use to smoke 1 pk/wk then picked up to 1/2 pk a day in past 10 yrs.  Plans to quit gradually.  HL:  taking and tolerating Atorvastatin daily.    HM: Agreeable to receiving second shingles vaccine.  She is due for Pap smear.  Wants to be rescheduled for subsequent visit.  Cologuard ordered 06/2022 by nurse practitioner for colon cancer screening.  She has not done this as yet.  Has to find the box.    Patient Active Problem List   Diagnosis Date Noted   Morbid obesity with BMI of 45.0-49.9, adult (HCC) 04/29/2021   Mixed hyperlipidemia 04/29/2021   Vitiligo 04/29/2021   Chronic pain of both knees 04/29/2021   Chronic radicular pain of lower back 04/29/2021   Atypical chest pain 01/30/2012   Anxiety 01/30/2012   Tobacco dependence 02/03/2009   OBESITY NOS 07/31/2006   Essential hypertension 07/31/2006     Current Outpatient Medications on File Prior to Visit  Medication Sig Dispense Refill   Cholecalciferol (VITAMIN D3) 25 MCG (1000 UT) CAPS Take 1  capsule (1,000 Units total) by mouth daily. 100 capsule 1   valsartan (DIOVAN) 40 MG tablet Take 1 tablet (40 mg total) by mouth daily. 90 tablet 3   atorvastatin (LIPITOR) 10 MG tablet Take 1 tablet (10 mg total) by mouth daily. (Patient not taking: Reported on 09/30/2022) 90 tablet 3   Blood Pressure Monitor DEVI Please provide patient with insurance approved blood pressure monitor. I10.0 (Patient not taking: Reported on 09/30/2022) 1 each 0   Vitamin D, Ergocalciferol, (DRISDOL) 1.25 MG (50000 UNIT) CAPS capsule Take 1 capsule (50,000 Units total) by mouth every 7 (seven) days. 12 capsule 1   No current facility-administered medications on file prior to visit.    Allergies  Allergen Reactions   Citrus Hives    Paxil [Paroxetine] Other (See Comments)    Change in vision like light flashing on and off.  Also caused tremors    Social History   Socioeconomic History   Marital status: Single    Spouse name: Not on file   Number of children: Not on file   Years of education: Not on file   Highest education level: Some college, no degree  Occupational History   Not on file  Tobacco Use   Smoking status: Some Days    Packs/day: .5    Types: Cigarettes   Smokeless tobacco: Never  Vaping Use   Vaping Use: Never used  Substance and Sexual Activity   Alcohol use: Not Currently    Comment: occasionally   Drug use: No   Sexual activity: Not Currently  Other Topics Concern   Not on file  Social History Narrative   Not on file   Social Determinants of Health   Financial Resource Strain: Medium Risk (08/28/2022)   Overall Financial Resource Strain (CARDIA)    Difficulty of Paying Living Expenses: Somewhat hard  Food Insecurity: Food Insecurity Present (08/28/2022)   Hunger Vital Sign    Worried About Running Out of Food in the Last Year: Sometimes true    Ran Out of Food in the Last Year: Sometimes true  Transportation Needs: No Transportation Needs (08/28/2022)   PRAPARE - Administrator, Civil Service (Medical): No    Lack of Transportation (Non-Medical): No  Physical Activity: Insufficiently Active (08/28/2022)   Exercise Vital Sign    Days of Exercise per Week: 2 days    Minutes of Exercise per Session: 20 min  Stress: Stress Concern Present (08/28/2022)   [employer] of Occupational Health - Occupational Stress Questionnaire    Feeling of Stress : To some extent  Social Connections: Moderately Isolated (08/28/2022)   Social Connection and Isolation Panel [NHANES]    Frequency of Communication with Friends and Family: More than three times a week    Frequency of Social Gatherings with Friends and Family: Once a week    Attends Religious Services: 1 to 4 times per year     Active Member of [employer] or Organizations: No    Attends Engineer, structural: Not on file    Marital Status: Never married  Intimate Partner Violence: Not At Risk (09/01/2022)   Humiliation, Afraid, Rape, and Kick questionnaire    Fear of Current or Ex-Partner: No    Emotionally Abused: No    Physically Abused: No    Sexually Abused: No    Family History  Problem Relation Age of Onset   Hypertension Mother    Hypertension Father    Breast cancer Maternal Aunt  Past Surgical History:  Procedure Laterality Date   CESAREAN SECTION      ROS: Review of Systems Negative except as stated above  PHYSICAL EXAM: BP (!) 165/90   Pulse 84   Temp 98.3 F (36.8 C) (Oral)   Ht 5\' 3"  (1.6 m)   Wt 279 lb (126.6 kg)   LMP 08/11/2019 (Approximate) Comment: tubaligation   SpO2 94%   BMI 49.42 kg/m   Wt Readings from Last 3 Encounters:  09/30/22 279 lb (126.6 kg)  06/27/22 278 lb 9.6 oz (126.4 kg)  04/29/21 276 lb 9.6 oz (125.5 kg)    Physical Exam  General appearance - alert, well appearing, obese middle-age African-American female and in no distress Mental status - normal mood, behavior, speech, dress, motor activity, and thought processes Nose - normal and patent, no erythema, discharge or polyps.  No tenderness on palpation over the maxillary sinuses. Mouth - mucous membranes moist, pharynx normal without lesions Chest - clear to auscultation, no wheezes, rales or rhonchi, symmetric air entry Heart - normal rate, regular rhythm, normal S1, S2, no murmurs, rubs, clicks or gallops Extremities - peripheral pulses normal, no pedal edema, no clubbing or cyanosis Skin: She has significant vitiligo changes on her face and over her extremities.    09/30/2022   11:12 AM 06/27/2022   10:38 AM 04/29/2021   11:16 AM  Depression screen PHQ 2/9  Decreased Interest 1 2 0  Down, Depressed, Hopeless 2 1 1   PHQ - 2 Score 3 3 1   Altered sleeping 3 3   Tired, decreased energy 2 2    Change in appetite 0 0   Feeling bad or failure about yourself  0 2   Trouble concentrating 2 1   Moving slowly or fidgety/restless 0 0   Suicidal thoughts 0 0   PHQ-9 Score 10 11       09/30/2022   11:13 AM 06/27/2022   10:39 AM 04/29/2021   11:16 AM  GAD 7 : Generalized Anxiety Score  Nervous, Anxious, on Edge 2 2 2   Control/stop worrying 1 2 0  Worry too much - different things 0 2 0  Trouble relaxing 3 2 2   Restless 1 0 2  Easily annoyed or irritable 3 2 1   Afraid - awful might happen 0 2 2  Total GAD 7 Score 10 12 9         Latest Ref Rng & Units 07/25/2022    9:43 AM 06/27/2022   11:10 AM 03/01/2021    9:43 AM  CMP  Glucose 70 - 99 mg/dL 191  91  478   BUN 6 - 24 mg/dL 9  6  12    Creatinine 0.57 - 1.00 mg/dL 2.95  6.21  3.08   Sodium 134 - 144 mmol/L 144  142  143   Potassium 3.5 - 5.2 mmol/L 4.3  3.9  4.2   Chloride 96 - 106 mmol/L 107  106  104   CO2 20 - 29 mmol/L 20  23  20    Calcium 8.7 - 10.2 mg/dL 9.2  8.9  9.4   Total Protein 6.0 - 8.5 g/dL  6.8  7.0   Total Bilirubin 0.0 - 1.2 mg/dL  0.2  0.3   Alkaline Phos 44 - 121 IU/L  94  85   AST 0 - 40 IU/L  25  12   ALT 0 - 32 IU/L  22  12    Lipid Panel     Component  Value Date/Time   CHOL 164 06/27/2022 1110   TRIG 116 06/27/2022 1110   HDL 29 (L) 06/27/2022 1110   CHOLHDL 5.7 (H) 06/27/2022 1110   CHOLHDL 5.3 Ratio 07/31/2006 2043   VLDL 18 07/31/2006 2043   LDLCALC 114 (H) 06/27/2022 1110    CBC    Component Value Date/Time   WBC 9.0 06/27/2022 1110   WBC 8.0 05/29/2019 0809   RBC 5.73 (H) 06/27/2022 1110   RBC 5.39 (H) 05/29/2019 0809   HGB 14.1 06/27/2022 1110   HCT 43.9 06/27/2022 1110   PLT 217 06/27/2022 1110   MCV 77 (L) 06/27/2022 1110   MCH 24.6 (L) 06/27/2022 1110   MCH 26.2 05/29/2019 0809   MCHC 32.1 06/27/2022 1110   MCHC 32.4 05/29/2019 0809   RDW 15.1 06/27/2022 1110   LYMPHSABS 3.1 06/27/2022 1110   MONOABS 1.2 (H) 03/06/2014 2131   EOSABS 0.1 06/27/2022 1110   BASOSABS 0.0  06/27/2022 1110   Results for orders placed or performed in visit on 09/30/22  POCT URINALYSIS DIP (CLINITEK)  Result Value Ref Range   Color, UA yellow yellow   Clarity, UA cloudy (A) clear   Glucose, UA negative negative mg/dL   Bilirubin, UA negative negative   Ketones, POC UA negative negative mg/dL   Spec Grav, UA 4.540 9.811 - 1.025   Blood, UA negative negative   pH, UA 5.5 5.0 - 8.0   POC PROTEIN,UA =30 (A) negative, trace   Urobilinogen, UA 0.2 0.2 or 1.0 E.U./dL   Nitrite, UA Negative Negative   Leukocytes, UA Negative Negative    ASSESSMENT AND PLAN:  1. Essential hypertension Not at goal. Recommend increase Diovan to 80 mg daily.  Patient was initially reluctant but then agreed.  Discussed health risks associated with uncontrolled blood pressure.  She will start taking 2 of the 40 mg tablets daily until she runs out of her current bottle.  Prescription sent to the pharmacy for the 80 mg tablets.  We will plan to check BMP on her follow-up visit with me in several weeks when she comes to have Pap smear done.  2. Dysuria UA is negative. - POCT URINALYSIS DIP (CLINITEK)  3. Viral upper respiratory tract infection Advised to continue the Allegra but discontinue the Mucinex DM.  Use Flonase over-the-counter daily as needed.  4. Vitamin D deficiency Continue vitamin D over-the-counter 1000 IU daily - VITAMIN D 25 Hydroxy (Vit-D Deficiency, Fractures)  5. Tobacco dependence Commended her on cutting back.  Strongly advised her to quit.  Declines medicines to help her quit.  She does not meet criteria for lung cancer screening as yet.  6. Perimenopause Discussed her perimenopausal symptoms.  Informed that HRT is the best option in terms of control of vasomotor symptoms and would also help with mood swings.  However she is at rather increased risks given uncontrolled blood pressure, tobacco dependence and obesity for cardiovascular events.  We discussed alternative options  including using an SSRI but patient states she has had a bad reaction to Paxil.  She also declined trial of gabapentin.  She ultimately declined HRT as well.  7. Need for shingles vaccine Patient was agreeable to receiving the shingles vaccine today but we forgot to give it.  We will plan to give it on her follow-up visit with me in several weeks when she comes for her Pap smear.  8. Mixed hyperlipidemia Continue atorvastatin.  We will recheck lipid profile and liver panel - Lipid panel -  Hepatic Function Panel  9. Positive depression screening 10. GAD (generalized anxiety disorder) Patient declines medication.  She also declines being plugged in with behavioral health.  11. Screening for colon cancer Encouraged her to use the Cologuard test that she has at home and send it in as soon as possible.  She promises to do so.    Patient was given the opportunity to ask questions.  Patient verbalized understanding of the plan and was able to repeat key elements of the plan.   This documentation was completed using Paediatric nurse.  Any transcriptional errors are unintentional.  Orders Placed This Encounter  Procedures   VITAMIN D 25 Hydroxy (Vit-D Deficiency, Fractures)   Lipid panel   Hepatic Function Panel   POCT URINALYSIS DIP (CLINITEK)     Requested Prescriptions    No prescriptions requested or ordered in this encounter    Return in about 6 weeks (around 11/11/2022) for PAP and blood test.  Jonah Blue, MD, FACP

## 2022-09-30 NOTE — Patient Instructions (Addendum)
Your blood pressure is not at goal.  I recommend increasing the valsartan to 80 mg daily.  This means you should start taking 2 of the 40 mg tablets daily until you run out.  I will send an updated prescription to your pharmacy for the 80 mg tablet. We will plan to check your kidney function in 4 to 6 weeks when I see you to do your Pap smear.  Please remember to use the Cologuard kit and send it off to be screened for colon cancer.

## 2022-10-01 LAB — VITAMIN D 25 HYDROXY (VIT D DEFICIENCY, FRACTURES): Vit D, 25-Hydroxy: 30.1 ng/mL (ref 30.0–100.0)

## 2022-10-01 LAB — LIPID PANEL
Chol/HDL Ratio: 6.3 ratio — ABNORMAL HIGH (ref 0.0–4.4)
Cholesterol, Total: 176 mg/dL (ref 100–199)
HDL: 28 mg/dL — ABNORMAL LOW (ref >39)
LDL Chol Calc (NIH): 113 mg/dL — ABNORMAL HIGH (ref 0–99)
Triglycerides: 197 mg/dL — ABNORMAL HIGH (ref 0–149)
VLDL Cholesterol Cal: 35 mg/dL (ref 5–40)

## 2022-10-01 LAB — HEPATIC FUNCTION PANEL
ALT: 15 IU/L (ref 0–32)
AST: 17 IU/L (ref 0–40)
Albumin: 4 g/dL (ref 3.8–4.9)
Alkaline Phosphatase: 95 IU/L (ref 44–121)
Bilirubin Total: 0.3 mg/dL (ref 0.0–1.2)
Bilirubin, Direct: 0.11 mg/dL (ref 0.00–0.40)
Total Protein: 6.8 g/dL (ref 6.0–8.5)

## 2022-10-15 ENCOUNTER — Ambulatory Visit: Payer: Medicaid Other | Attending: Nurse Practitioner | Admitting: Nurse Practitioner

## 2022-10-15 ENCOUNTER — Encounter: Payer: Self-pay | Admitting: Nurse Practitioner

## 2022-10-15 ENCOUNTER — Ambulatory Visit: Payer: Self-pay

## 2022-10-15 VITALS — BP 139/85 | HR 72 | Ht 63.0 in | Wt 278.6 lb

## 2022-10-15 DIAGNOSIS — Z77098 Contact with and (suspected) exposure to other hazardous, chiefly nonmedicinal, chemicals: Secondary | ICD-10-CM | POA: Diagnosis not present

## 2022-10-15 DIAGNOSIS — J069 Acute upper respiratory infection, unspecified: Secondary | ICD-10-CM | POA: Diagnosis not present

## 2022-10-15 MED ORDER — PROMETHAZINE-DM 6.25-15 MG/5ML PO SYRP
5.0000 mL | ORAL_SOLUTION | Freq: Four times a day (QID) | ORAL | 0 refills | Status: DC | PRN
Start: 2022-10-15 — End: 2023-03-17

## 2022-10-15 MED ORDER — PREDNISONE 20 MG PO TABS
20.0000 mg | ORAL_TABLET | Freq: Every day | ORAL | 0 refills | Status: AC
Start: 2022-10-15 — End: 2022-10-18

## 2022-10-15 NOTE — Progress Notes (Signed)
Assessment & Plan:  Ciel was seen today for cough.  Diagnoses and all orders for this visit:  URI with cough and congestion -     CBC with Differential -     predniSONE (DELTASONE) 20 MG tablet; Take 1 tablet (20 mg total) by mouth daily with breakfast for 3 days. -     promethazine-dextromethorphan (PROMETHAZINE-DM) 6.25-15 MG/5ML syrup; Take 5 mLs by mouth 4 (four) times daily as needed for cough.  Exposure to chemical inhalation -     CO2, total    Patient has been counseled on age-appropriate routine health concerns for screening and prevention. These are reviewed and up-to-date. Referrals have been placed accordingly. Immunizations are up-to-date or declined.    Subjective:   Chief Complaint  Patient presents with   Cough   HPI Kaitlyn Robertson 57 y.o. female presents to office today for URI with cough.  She is a patient of Dr. Laural Benes.  She has a past medical history of tobacco use, hypertension, GERD and sciatica   States she was told there was a valve on her water heater that needed to be replaced. The valve is also connected to the gas line. Since she has been coughing for a few weeks, wants to make sure she has not been exposed to toxic levels of gas. She is also a smoker. States cough is dry and sometimes productive of clear sputum. Seems to be improving this week.  She denies any CNS symptoms or any associated URI symptoms aside from rhinorrhea.   Blood pressure is on the higher side of normal today however she endorses medication adherence with taking valsartan 80 mg daily  BP Readings from Last 3 Encounters:  10/15/22 139/85  09/30/22 (!) 165/90  09/01/22 (!) 150/86     Review of Systems  Constitutional:  Negative for fever, malaise/fatigue and weight loss.  HENT:  Negative for nosebleeds.        See HPI  Eyes: Negative.  Negative for blurred vision, double vision and photophobia.  Respiratory:  Positive for cough and sputum production. Negative for  hemoptysis, shortness of breath and wheezing.   Cardiovascular: Negative.  Negative for chest pain, palpitations and leg swelling.  Gastrointestinal: Negative.  Negative for heartburn, nausea and vomiting.  Musculoskeletal: Negative.  Negative for myalgias.  Neurological: Negative.  Negative for dizziness, focal weakness, seizures and headaches.  Psychiatric/Behavioral: Negative.  Negative for suicidal ideas.     Past Medical History:  Diagnosis Date   Anxiety    GERD (gastroesophageal reflux disease)    Hypertension    Sciatica     Past Surgical History:  Procedure Laterality Date   CESAREAN SECTION      Family History  Problem Relation Age of Onset   Hypertension Mother    Hypertension Father    Breast cancer Maternal Aunt     Social History Reviewed with no changes to be made today.   Outpatient Medications Prior to Visit  Medication Sig Dispense Refill   atorvastatin (LIPITOR) 10 MG tablet Take 1 tablet (10 mg total) by mouth daily. 90 tablet 3   Cholecalciferol (VITAMIN D3) 25 MCG (1000 UT) CAPS Take 1 capsule (1,000 Units total) by mouth daily. 100 capsule 1   valsartan (DIOVAN) 80 MG tablet Take 1 tablet (80 mg total) by mouth daily. 90 tablet 1   Blood Pressure Monitor DEVI Please provide patient with insurance approved blood pressure monitor. I10.0 (Patient not taking: Reported on 10/15/2022) 1 each 0  Vitamin D, Ergocalciferol, (DRISDOL) 1.25 MG (50000 UNIT) CAPS capsule Take 1 capsule (50,000 Units total) by mouth every 7 (seven) days. 12 capsule 1   No facility-administered medications prior to visit.    Allergies  Allergen Reactions   Citrus Hives   Paxil [Paroxetine] Other (See Comments)    Change in vision like light flashing on and off.  Also caused tremors       Objective:    BP 139/85 (BP Location: Left Arm, Patient Position: Sitting, Cuff Size: Large)   Pulse 72   Ht 5\' 3"  (1.6 m)   Wt 278 lb 9.6 oz (126.4 kg)   LMP 08/11/2019 (Approximate)  Comment: tubaligation   SpO2 96%   BMI 49.35 kg/m  Wt Readings from Last 3 Encounters:  10/15/22 278 lb 9.6 oz (126.4 kg)  09/30/22 279 lb (126.6 kg)  06/27/22 278 lb 9.6 oz (126.4 kg)    Physical Exam Vitals and nursing note reviewed.  Constitutional:      Appearance: She is well-developed.  HENT:     Head: Normocephalic and atraumatic.  Cardiovascular:     Rate and Rhythm: Normal rate and regular rhythm.     Heart sounds: Normal heart sounds. No murmur heard.    No friction rub. No gallop.  Pulmonary:     Effort: Pulmonary effort is normal. No tachypnea or respiratory distress.     Breath sounds: Normal breath sounds. No decreased breath sounds, wheezing, rhonchi or rales.  Chest:     Chest wall: No tenderness.  Abdominal:     General: Bowel sounds are normal.     Palpations: Abdomen is soft.  Musculoskeletal:        General: Normal range of motion.     Cervical back: Normal range of motion.  Skin:    General: Skin is warm and dry.  Neurological:     Mental Status: She is alert and oriented to person, place, and time.     Coordination: Coordination normal.  Psychiatric:        Behavior: Behavior normal. Behavior is cooperative.        Thought Content: Thought content normal.        Judgment: Judgment normal.          Patient has been counseled extensively about nutrition and exercise as well as the importance of adherence with medications and regular follow-up. The patient was given clear instructions to go to ER or return to medical center if symptoms don't improve, worsen or new problems develop. The patient verbalized understanding.   Follow-up: Return if symptoms worsen or fail to improve.   Claiborne Rigg, FNP-BC [facility] and [facility] [place], [place] [phone]   10/15/2022, 12:20 PM

## 2022-10-15 NOTE — Progress Notes (Signed)
On going cough for 2 weeks. Home has gas leak and could be attributing to cough.

## 2022-10-15 NOTE — Telephone Encounter (Signed)
     Chief Complaint: Cough x 2 weeks. Has had exposure to natural gas in home. Had chest pain 2 days ago that lasted less than 5 minutes after walking down stairs. Symptoms: Above Frequency: 2 weeks Pertinent Negatives: Patient denies fever, no chest pain today Disposition: [] ED /[] Urgent Care (no appt availability in office) / [x] Appointment(In office/virtual)/ []  [facility]/ [] Home Care/ [] Refused Recommended Disposition /[] [facility]/ []  Follow-up with PCP Additional Notes:   Reason for Disposition  [1] Continuous (nonstop) coughing interferes with work or school AND [2] no improvement using cough treatment per Care Advice  Answer Assessment - Initial Assessment Questions 1. ONSET: "When did the cough begin?"      2 weeks ago 2. SEVERITY: "How bad is the cough today?"      Moderate 3. SPUTUM: "Describe the color of your sputum" (none, dry cough; clear, white, yellow, green)     Clear 4. HEMOPTYSIS: "Are you coughing up any blood?" If so ask: "How much?" (flecks, streaks, tablespoons, etc.)     No 5. DIFFICULTY BREATHING: "Are you having difficulty breathing?" If Yes, ask: "How bad is it?" (e.g., mild, moderate, severe)    - MILD: No SOB at rest, mild SOB with walking, speaks normally in sentences, can lie down, no retractions, pulse < 100.    - MODERATE: SOB at rest, SOB with minimal exertion and prefers to sit, cannot lie down flat, speaks in phrases, mild retractions, audible wheezing, pulse 100-120.    - SEVERE: Very SOB at rest, speaks in single words, struggling to breathe, sitting hunched forward, retractions, pulse > 120      No 6. FEVER: "Do you have a fever?" If Yes, ask: "What is your temperature, how was it measured, and when did it start?"     No 7. CARDIAC HISTORY: "Do you have any history of heart disease?" (e.g., heart attack, congestive heart failure)      No 8. LUNG HISTORY: "Do you have any history of lung disease?"  (e.g., pulmonary  embolus, asthma, emphysema)     No 9. PE RISK FACTORS: "Do you have a history of blood clots?" (or: recent major surgery, recent prolonged travel, bedridden)     No 10. OTHER SYMPTOMS: "Do you have any other symptoms?" (e.g., runny nose, wheezing, chest pain)       No 11. PREGNANCY: "Is there any chance you are pregnant?" "When was your last menstrual period?"       No 12. TRAVEL: "Have you traveled out of the country in the last month?" (e.g., travel history, exposures)       No  Protocols used: Cough - Acute Productive-A-AH

## 2022-10-16 LAB — CBC WITH DIFFERENTIAL/PLATELET
Basophils Absolute: 0 10*3/uL (ref 0.0–0.2)
Basos: 0 %
EOS (ABSOLUTE): 0.1 10*3/uL (ref 0.0–0.4)
Eos: 1 %
Hematocrit: 44.2 % (ref 34.0–46.6)
Hemoglobin: 14.3 g/dL (ref 11.1–15.9)
Immature Grans (Abs): 0 10*3/uL (ref 0.0–0.1)
Immature Granulocytes: 0 %
Lymphocytes Absolute: 2.8 10*3/uL (ref 0.7–3.1)
Lymphs: 33 %
MCH: 24.7 pg — ABNORMAL LOW (ref 26.6–33.0)
MCHC: 32.4 g/dL (ref 31.5–35.7)
MCV: 76 fL — ABNORMAL LOW (ref 79–97)
Monocytes Absolute: 0.5 10*3/uL (ref 0.1–0.9)
Monocytes: 6 %
Neutrophils Absolute: 5.1 10*3/uL (ref 1.4–7.0)
Neutrophils: 60 %
Platelets: 208 10*3/uL (ref 150–450)
RBC: 5.79 x10E6/uL — ABNORMAL HIGH (ref 3.77–5.28)
RDW: 15.2 % (ref 11.7–15.4)
WBC: 8.5 10*3/uL (ref 3.4–10.8)

## 2022-10-16 LAB — CO2, TOTAL: CO2: 20 mmol/L (ref 20–29)

## 2022-12-29 ENCOUNTER — Ambulatory Visit: Payer: Medicaid Other | Admitting: Internal Medicine

## 2023-03-17 ENCOUNTER — Ambulatory Visit: Payer: Medicaid Other | Attending: Internal Medicine | Admitting: Internal Medicine

## 2023-03-17 ENCOUNTER — Other Ambulatory Visit (HOSPITAL_COMMUNITY)
Admission: RE | Admit: 2023-03-17 | Discharge: 2023-03-17 | Disposition: A | Discharge status: Home / Self Care | Payer: Medicaid Other | Source: Ambulatory Visit | Attending: Internal Medicine | Admitting: Internal Medicine

## 2023-03-17 ENCOUNTER — Encounter: Payer: Self-pay | Admitting: Internal Medicine

## 2023-03-17 VITALS — BP 146/98 | HR 85 | Temp 98.1°F | Ht 63.0 in | Wt 289.0 lb

## 2023-03-17 DIAGNOSIS — N898 Other specified noninflammatory disorders of vagina: Secondary | ICD-10-CM | POA: Diagnosis not present

## 2023-03-17 DIAGNOSIS — I10 Essential (primary) hypertension: Secondary | ICD-10-CM | POA: Diagnosis not present

## 2023-03-17 DIAGNOSIS — Z124 Encounter for screening for malignant neoplasm of cervix: Secondary | ICD-10-CM | POA: Diagnosis not present

## 2023-03-17 DIAGNOSIS — E66813 Obesity, class 3: Secondary | ICD-10-CM | POA: Diagnosis not present

## 2023-03-17 DIAGNOSIS — R300 Dysuria: Secondary | ICD-10-CM | POA: Diagnosis not present

## 2023-03-17 DIAGNOSIS — Z6841 Body Mass Index (BMI) 40.0 and over, adult: Secondary | ICD-10-CM

## 2023-03-17 DIAGNOSIS — E782 Mixed hyperlipidemia: Secondary | ICD-10-CM

## 2023-03-17 DIAGNOSIS — Z23 Encounter for immunization: Secondary | ICD-10-CM

## 2023-03-17 DIAGNOSIS — M79641 Pain in right hand: Secondary | ICD-10-CM

## 2023-03-17 DIAGNOSIS — Z2821 Immunization not carried out because of patient refusal: Secondary | ICD-10-CM | POA: Diagnosis not present

## 2023-03-17 LAB — POCT URINALYSIS DIP (CLINITEK)
Bilirubin, UA: NEGATIVE
Blood, UA: NEGATIVE
Glucose, UA: NEGATIVE mg/dL
Ketones, POC UA: NEGATIVE mg/dL
Nitrite, UA: NEGATIVE
POC PROTEIN,UA: 100 — AB
Spec Grav, UA: 1.025 (ref 1.010–1.025)
Urobilinogen, UA: 0.2 U/dL
pH, UA: 5.5 (ref 5.0–8.0)

## 2023-03-17 MED ORDER — PRAVASTATIN SODIUM 10 MG PO TABS: 10.0000 mg | ORAL_TABLET | Freq: Every day | ORAL | 1 refills | Status: DC

## 2023-03-17 MED ORDER — VALSARTAN 80 MG PO TABS: 80.0000 mg | ORAL_TABLET | Freq: Every day | ORAL | 1 refills | Status: DC

## 2023-03-17 NOTE — Patient Instructions (Addendum)
Please take the Diovan 80 mg daily as prescribed.  I have sent an updated prescription to your pharmacy for the 80 mg tablets.  Stop atorvastatin.  Start pravastatin 10 mg daily instead.  Please go to [facility] imaging at [address] to have the x-rays done on the right hand.  You should ice the right hand.  Okay to take Tylenol as needed for pain.  We have referred you to the gynecologist to have your Pap smear done.

## 2023-03-17 NOTE — Addendum Note (Signed)
Addended by: Johna Roles on: 03/17/2023 01:33 PM   Modules accepted: Orders

## 2023-03-17 NOTE — Progress Notes (Addendum)
Patient ID: Kaitlyn Robertson, female    DOB: December 18, 1965  MRN: [id number]  CC: Hypertension (HTN f/u. [facility] Sites mood swings, "antsy" while taking atorvastatin - discuss possible alternative/Experiencing back muscle spasms X1 mo/Odor in urine, discomfort while urinating X1 week/Yes to shingles vax. No to flu vax)   Subjective: Kaitlyn Robertson is a 57 y.o. female who presents for  pap and f/u BP. Her concerns today include:  Patient with history of HTN, obesity, tob dep, anxiety/dep, GERD, OA hips, sciatica, Vitilago   HTN:  should be on Diovan 80mg  but has been taking 40 mg  1.5 tabs daily because she thought BP was too low on the 80 mg.  Still had a lot of the 40 mg tabs.  BP was 120-132/72 when took the 80 mg. Checks BP daily.  Gives range140-150/70-80s, lowest 132/76 2 days ago on 60 mg Limits salt in foods.    HL:  on Lipitor 10 mg daily.  Reports moodiness and very snappy on the med  Complains of pain in the right wrist  and hand post fall 2 days ago.  Soreness and swelling noted over the dorsal 4th and 5th MC.  Landed on concrete with outstretch hand.  She did ice it  GYN History:  Pt is G2P2 Any hx of abn paps?: no Menses regular or irregular?: postmenopausa;l How long does menses last? NA Menstrual flow light or heavy?: NA Method of birth control?:  NA Any vaginal dischg at this time?: itching Dysuria?: Discomfort with urination like a tingling but not every time x 1 wk and urine odor x few days Any hx of STI?: no Sexually active with how many partners: not active in past 4 yrs Desires STI screen:  no but check for BV and yeast Last MMG: 07/2022 Family hx of uterine, cervical or breast cancer?:  breast CA in maternal aunt.   Patient Active Problem List   Diagnosis Date Noted   Morbid obesity with BMI of 45.0-49.9, adult (HCC) 04/29/2021   Mixed hyperlipidemia 04/29/2021   Vitiligo 04/29/2021   Chronic pain of both knees 04/29/2021   Chronic radicular pain of lower back  04/29/2021   Atypical chest pain 01/30/2012   Anxiety 01/30/2012   Tobacco dependence 02/03/2009   OBESITY NOS 07/31/2006   Essential hypertension 07/31/2006     Current Outpatient Medications on File Prior to Visit  Medication Sig Dispense Refill   atorvastatin (LIPITOR) 10 MG tablet Take 1 tablet (10 mg total) by mouth daily. 90 tablet 3   valsartan (DIOVAN) 80 MG tablet Take 1 tablet (80 mg total) by mouth daily. 90 tablet 1   Blood Pressure Monitor DEVI Please provide patient with insurance approved blood pressure monitor. I10.0 (Patient not taking: Reported on 10/15/2022) 1 each 0   promethazine-dextromethorphan (PROMETHAZINE-DM) 6.25-15 MG/5ML syrup Take 5 mLs by mouth 4 (four) times daily as needed for cough. (Patient not taking: Reported on 03/17/2023) 240 mL 0   No current facility-administered medications on file prior to visit.    Allergies  Allergen Reactions   Citrus Hives   Paxil [Paroxetine] Other (See Comments)    Change in vision like light flashing on and off.  Also caused tremors    Social History   Socioeconomic History   Marital status: Single    Spouse name: Not on file   Number of children: Not on file   Years of education: Not on file   Highest education level: Some college, no degree  Occupational History  Not on file  Tobacco Use   Smoking status: Some Days    Current packs/day: 0.50    Average packs/day: 0.5 packs/day for 31.0 years (15.5 ttl pk-yrs)    Types: Cigarettes   Smokeless tobacco: Never  Vaping Use   Vaping status: Never Used  Substance and Sexual Activity   Alcohol use: Not Currently    Comment: occasionally   Drug use: No   Sexual activity: Not Currently  Other Topics Concern   Not on file  Social History Narrative   Not on file   Social Determinants of Health   Financial Resource Strain: Medium Risk (03/13/2023)   Overall Financial Resource Strain (CARDIA)    Difficulty of Paying Living Expenses: Somewhat hard  Food  Insecurity: Food Insecurity Present (03/13/2023)   Hunger Vital Sign    Worried About Running Out of Food in the Last Year: Sometimes true    Ran Out of Food in the Last Year: Sometimes true  Transportation Needs: No Transportation Needs (03/13/2023)   PRAPARE - Administrator, Civil Service (Medical): No    Lack of Transportation (Non-Medical): No  Physical Activity: Insufficiently Active (03/13/2023)   Exercise Vital Sign    Days of Exercise per Week: 3 days    Minutes of Exercise per Session: 20 min  Stress: Stress Concern Present (03/13/2023)   [employer] of Occupational Health - Occupational Stress Questionnaire    Feeling of Stress : To some extent  Social Connections: Moderately Integrated (03/13/2023)   Social Connection and Isolation Panel [NHANES]    Frequency of Communication with Friends and Family: More than three times a week    Frequency of Social Gatherings with Friends and Family: More than three times a week    Attends Religious Services: 1 to 4 times per year    Active Member of [employer] or Organizations: Yes    Attends Banker Meetings: More than 4 times per year    Marital Status: Never married  Intimate Partner Violence: Not At Risk (09/01/2022)   Humiliation, Afraid, Rape, and Kick questionnaire    Fear of Current or Ex-Partner: No    Emotionally Abused: No    Physically Abused: No    Sexually Abused: No    Family History  Problem Relation Age of Onset   Hypertension Mother    Hypertension Father    Breast cancer Maternal Aunt     Past Surgical History:  Procedure Laterality Date   CESAREAN SECTION      ROS: Review of Systems Negative except as stated above  PHYSICAL EXAM: BP (!) 146/98 (BP Location: Left Arm, Patient Position: Sitting, Cuff Size: Large)   Pulse 85   Temp 98.1 F (36.7 C) (Oral)   Ht 5\' 3"  (1.6 m)   Wt 289 lb (131.1 kg)   LMP 08/11/2019 (Approximate) Comment: tubaligation   SpO2 92%   BMI 51.19  kg/m   Physical Exam  General appearance - alert, well appearing, and in no distress Mental status - normal mood, behavior, speech, dress, motor activity, and thought processes Pelvic -my CMA Clarissa is present as chaperone: No external vaginal lesions.  She has vitiligo.  Pelvic exam was very difficult due to patient's discomfort and cervix was difficult to locate.  Attempt was aborted after several tries.  No lesions noted within the vagina.  She had small amount of thin white discharge. Musculoskeletal -right hand/wrist: Patient has mild swelling of the soft tissue of the fourth and fifth  metacarpals dorsal surface.  Mild tenderness on palpation in this area.  No swelling noted of the wrist.  She has good range of motion of the wrist.  Results for orders placed or performed in visit on 03/17/23  POCT URINALYSIS DIP (CLINITEK)  Result Value Ref Range   Color, UA yellow yellow   Clarity, UA cloudy (A) clear   Glucose, UA negative negative mg/dL   Bilirubin, UA negative negative   Ketones, POC UA negative negative mg/dL   Spec Grav, UA 0.981 1.914 - 1.025   Blood, UA negative negative   pH, UA 5.5 5.0 - 8.0   POC PROTEIN,UA =100 (A) negative, trace   Urobilinogen, UA 0.2 0.2 or 1.0 E.U./dL   Nitrite, UA Negative Negative   Leukocytes, UA Trace (A) Negative       Latest Ref Rng & Units 10/15/2022    1:34 PM 09/30/2022   12:17 PM 07/25/2022    9:43 AM  CMP  Glucose 70 - 99 mg/dL   782   BUN 6 - 24 mg/dL   9   Creatinine 9.56 - 1.00 mg/dL   2.13   Sodium 086 - 578 mmol/L   144   Potassium 3.5 - 5.2 mmol/L   4.3   Chloride 96 - 106 mmol/L   107   CO2 20 - 29 mmol/L 20   20   Calcium 8.7 - 10.2 mg/dL   9.2   Total Protein 6.0 - 8.5 g/dL  6.8    Total Bilirubin 0.0 - 1.2 mg/dL  0.3    Alkaline Phos 44 - 121 IU/L  95    AST 0 - 40 IU/L  17    ALT 0 - 32 IU/L  15     Lipid Panel     Component Value Date/Time   CHOL 176 09/30/2022 1217   TRIG 197 (H) 09/30/2022 1217   HDL 28 (L)  09/30/2022 1217   CHOLHDL 6.3 (H) 09/30/2022 1217   CHOLHDL 5.3 Ratio 07/31/2006 2043   VLDL 18 07/31/2006 2043   LDLCALC 113 (H) 09/30/2022 1217    CBC    Component Value Date/Time   WBC 8.5 10/15/2022 1334   WBC 8.0 05/29/2019 0809   RBC 5.79 (H) 10/15/2022 1334   RBC 5.39 (H) 05/29/2019 0809   HGB 14.3 10/15/2022 1334   HCT 44.2 10/15/2022 1334   PLT 208 10/15/2022 1334   MCV 76 (L) 10/15/2022 1334   MCH 24.7 (L) 10/15/2022 1334   MCH 26.2 05/29/2019 0809   MCHC 32.4 10/15/2022 1334   MCHC 32.4 05/29/2019 0809   RDW 15.2 10/15/2022 1334   LYMPHSABS 2.8 10/15/2022 1334   MONOABS 1.2 (H) 03/06/2014 2131   EOSABS 0.1 10/15/2022 1334   BASOSABS 0.0 10/15/2022 1334   ASSESSMENT AND PLAN: 1. Essential hypertension Not at goal due to noncompliance with taking recommended dose of Diovan.. Advised patient that the blood pressure readings that she was getting when she took the 80 mg of Diovan were at goal of 130/80 or lower.  Advised to take the 80 mg daily which she states she will start doing. - valsartan (DIOVAN) 80 MG tablet; Take 1 tablet (80 mg total) by mouth daily.  Dispense: 90 tablet; Refill: 1  2. Mixed hyperlipidemia Stop atorvastatin since she reports that it causes moodiness.  We will try her with Pravachol instead. - pravastatin (PRAVACHOL) 10 MG tablet; Take 1 tablet (10 mg total) by mouth daily. STOP Atorvastatin  Dispense: 90 tablet;  Refill: 1  3. Dysuria UA today not consistent with UTI.  We will send urine culture. - Urine Culture  4. Hand pain, right Will get an x-ray of the right hand to rule out fracture of the fourth or fifth metacarpals.  Advised to continue to ice.  Can take Tylenol as needed. - DG Hand Complete Right; Future  5. Influenza vaccination declined Recommended.  Patient declined.  6. Need for shingles vaccine Second Shingrix vaccine given today.  7. Pap smear for cervical cancer screening Was unable to successfully complete the Pap  due to patient's discomfort.  Will refer to GYN to have it done. - Ambulatory referral to Gynecology  8. Vaginal itching - Cervicovaginal ancillary only   Patient was given the opportunity to ask questions.  Patient verbalized understanding of the plan and was able to repeat key elements of the plan.   This documentation was completed using Paediatric nurse.  Any transcriptional errors are unintentional.  No orders of the defined types were placed in this encounter.    Requested Prescriptions   Pending Prescriptions Disp Refills   valsartan (DIOVAN) 80 MG tablet 90 tablet 1    Sig: Take 1 tablet (80 mg total) by mouth daily.    No follow-ups on file.  Jonah Blue, MD, FACP

## 2023-03-18 ENCOUNTER — Other Ambulatory Visit: Payer: Self-pay | Admitting: Internal Medicine

## 2023-03-18 LAB — CERVICOVAGINAL ANCILLARY ONLY
Bacterial Vaginitis (gardnerella): POSITIVE — AB
Candida Glabrata: NEGATIVE
Candida Vaginitis: NEGATIVE
Comment: NEGATIVE
Comment: NEGATIVE
Comment: NEGATIVE

## 2023-03-18 MED ORDER — METRONIDAZOLE 500 MG PO TABS: 500.0000 mg | ORAL_TABLET | Freq: Two times a day (BID) | ORAL | 0 refills | Status: DC

## 2023-03-19 ENCOUNTER — Encounter: Payer: Self-pay | Admitting: Internal Medicine

## 2023-03-19 ENCOUNTER — Other Ambulatory Visit: Payer: Self-pay | Admitting: Internal Medicine

## 2023-03-19 MED ORDER — CIPROFLOXACIN HCL 500 MG PO TABS: 500.0000 mg | ORAL_TABLET | Freq: Two times a day (BID) | ORAL | 0 refills | Status: AC

## 2023-03-20 ENCOUNTER — Telehealth: Payer: Self-pay | Admitting: Internal Medicine

## 2023-03-20 LAB — URINE CULTURE

## 2023-03-20 NOTE — Telephone Encounter (Signed)
Phone call placed to patient this afternoon.  Patient informed that the urine culture did grow bacteria E. coli.  I have sent a prescription to her pharmacy for ciprofloxacin.  Patient states that she did see my message and she has already picked up the Cipro.

## 2023-06-01 ENCOUNTER — Encounter: Payer: Medicaid Other | Admitting: Obstetrics and Gynecology

## 2023-07-21 ENCOUNTER — Other Ambulatory Visit: Payer: Self-pay | Admitting: Internal Medicine

## 2023-07-21 ENCOUNTER — Ambulatory Visit: Payer: Medicaid Other | Attending: Internal Medicine | Admitting: Internal Medicine

## 2023-07-21 ENCOUNTER — Encounter: Payer: Self-pay | Admitting: Internal Medicine

## 2023-07-21 VITALS — BP 198/81 | HR 84 | Temp 97.5°F | Ht 63.0 in | Wt 285.0 lb

## 2023-07-21 DIAGNOSIS — Z6841 Body Mass Index (BMI) 40.0 and over, adult: Secondary | ICD-10-CM

## 2023-07-21 DIAGNOSIS — E782 Mixed hyperlipidemia: Secondary | ICD-10-CM

## 2023-07-21 DIAGNOSIS — G4709 Other insomnia: Secondary | ICD-10-CM | POA: Diagnosis not present

## 2023-07-21 DIAGNOSIS — Z1231 Encounter for screening mammogram for malignant neoplasm of breast: Secondary | ICD-10-CM

## 2023-07-21 DIAGNOSIS — Z23 Encounter for immunization: Secondary | ICD-10-CM | POA: Diagnosis not present

## 2023-07-21 DIAGNOSIS — F17210 Nicotine dependence, cigarettes, uncomplicated: Secondary | ICD-10-CM | POA: Diagnosis not present

## 2023-07-21 DIAGNOSIS — Z124 Encounter for screening for malignant neoplasm of cervix: Secondary | ICD-10-CM

## 2023-07-21 DIAGNOSIS — Z1211 Encounter for screening for malignant neoplasm of colon: Secondary | ICD-10-CM

## 2023-07-21 DIAGNOSIS — I10 Essential (primary) hypertension: Secondary | ICD-10-CM

## 2023-07-21 DIAGNOSIS — F17200 Nicotine dependence, unspecified, uncomplicated: Secondary | ICD-10-CM

## 2023-07-21 MED ORDER — AMLODIPINE BESYLATE 5 MG PO TABS: 5.0000 mg | ORAL_TABLET | Freq: Every day | ORAL | 1 refills | Status: DC

## 2023-07-21 MED ORDER — VALSARTAN 80 MG PO TABS: 80.0000 mg | ORAL_TABLET | Freq: Every day | ORAL | 1 refills | Status: DC

## 2023-07-21 NOTE — Progress Notes (Signed)
 Patient ID: Kaitlyn Robertson, female    DOB: 1965-07-02  MRN: [id number]  CC: Hypertension (HTN f/u. Ottis Stain on R hip for 3 mo - 2 falls since December /Back pain - requesting meds/No to flu vax)   Subjective: Kaitlyn Robertson is a 58 y.o. female who presents for chronic ds management. Her concerns today include:  Patient with history of HTN, obesity, tob dep, anxiety/dep, GERD, OA hips, sciatica, Vitilago   Discussed the use of AI scribe software for clinical note transcription with the patient, who gave verbal consent to proceed.  History of Present Illness   The patient, with a history of hypertension, presents with concerns about elevated blood pressure. She reports running out of her prescribed Diovan 80mg  and substituting with leftover 40mg  tablets for a week. She describes a noticeable difference in her blood pressure readings when taking the lower dose, with the lowest reading on the 80mg  dose being 138/76. She denies any associated chest pain, headaches, or shortness of breath.  HL/Obesity: The patient also reports consistent use of Pravachol for cholesterol management, with no adverse effects noted. She has been making lifestyle changes, including reducing salt and sugar intake, avoiding processed foods, and increasing physical activity.   Tob dep: She also reports a reduction in smoking, from two to three cigarettes a day, with a goal to quit completely.  Sleep disturbances have been a concern for the patient for a couple of years. She describes difficulty falling asleep and frequent awakenings, despite maintaining a consistent bedtime routine.  She gets in bed around 9:00.  She watches TV and turns it off at about 10:00 or wakes up because the TV is still on.  She denies consuming caffeinated or alcoholic beverages in the evening.  HM: Had referred to gynecology after last visit for Pap smear as we attempted to do the Pap but patient did not tolerate the exam.  She states she was called  but missed the appointment. -Due for colon cancer screening.  She has the Cologuard kit at home.  States that she needs to figure out where to take it after she uses it.  I told her that she needs to take it to [employer] store. Patient Active Problem List   Diagnosis Date Noted   Morbid obesity with BMI of 45.0-49.9, adult (HCC) 04/29/2021   Mixed hyperlipidemia 04/29/2021   Vitiligo 04/29/2021   Chronic pain of both knees 04/29/2021   Chronic radicular pain of lower back 04/29/2021   Atypical chest pain 01/30/2012   Anxiety 01/30/2012   Tobacco dependence 02/03/2009   OBESITY NOS 07/31/2006   Essential hypertension 07/31/2006     Current Outpatient Medications on File Prior to Visit  Medication Sig Dispense Refill   Blood Pressure Monitor DEVI Please provide patient with insurance approved blood pressure monitor. I10.0 1 each 0   pravastatin (PRAVACHOL) 10 MG tablet Take 1 tablet (10 mg total) by mouth daily. STOP Atorvastatin 90 tablet 1   valsartan (DIOVAN) 80 MG tablet Take 1 tablet (80 mg total) by mouth daily. 90 tablet 1   No current facility-administered medications on file prior to visit.    Allergies  Allergen Reactions   Atorvastatin     Caused moodiness   Citrus Hives   Paxil [Paroxetine] Other (See Comments)    Change in vision like light flashing on and off.  Also caused tremors    Social History   Socioeconomic History   Marital status: Single    Spouse name:  Not on file   Number of children: Not on file   Years of education: Not on file   Highest education level: Some college, no degree  Occupational History   Not on file  Tobacco Use   Smoking status: Some Days    Current packs/day: 0.50    Average packs/day: 0.5 packs/day for 31.0 years (15.5 ttl pk-yrs)    Types: Cigarettes   Smokeless tobacco: Never  Vaping Use   Vaping status: Never Used  Substance and Sexual Activity   Alcohol use: Not Currently    Comment: occasionally   Drug use: No   Sexual  activity: Not Currently  Other Topics Concern   Not on file  Social History Narrative   Not on file   Social Drivers of Health   Financial Resource Strain: Low Risk  (07/21/2023)   Overall Financial Resource Strain (CARDIA)    Difficulty of Paying Living Expenses: Not hard at all  Food Insecurity: Food Insecurity Present (07/21/2023)   Hunger Vital Sign    Worried About Running Out of Food in the Last Year: Sometimes true    Ran Out of Food in the Last Year: Sometimes true  Transportation Needs: No Transportation Needs (07/21/2023)   PRAPARE - Administrator, Civil Service (Medical): No    Lack of Transportation (Non-Medical): No  Physical Activity: Insufficiently Active (07/21/2023)   Exercise Vital Sign    Days of Exercise per Week: 2 days    Minutes of Exercise per Session: 20 min  Stress: Stress Concern Present (07/21/2023)   [employer] of Occupational Health - Occupational Stress Questionnaire    Feeling of Stress : To some extent  Social Connections: Moderately Isolated (07/21/2023)   Social Connection and Isolation Panel [NHANES]    Frequency of Communication with Friends and Family: More than three times a week    Frequency of Social Gatherings with Friends and Family: Once a week    Attends Religious Services: More than 4 times per year    Active Member of [employer] or Organizations: No    Attends Banker Meetings: Never    Marital Status: Never married  Intimate Partner Violence: Not At Risk (07/21/2023)   Humiliation, Afraid, Rape, and Kick questionnaire    Fear of Current or Ex-Partner: No    Emotionally Abused: No    Physically Abused: No    Sexually Abused: No    Family History  Problem Relation Age of Onset   Hypertension Mother    Hypertension Father    Breast cancer Maternal Aunt     Past Surgical History:  Procedure Laterality Date   CESAREAN SECTION      ROS: Review of Systems Negative except as stated above  PHYSICAL  EXAM: BP (!) 191/114 (BP Location: Left Arm, Patient Position: Sitting, Cuff Size: Large)   Pulse 84   Temp (!) 97.5 F (36.4 C) (Oral)   Ht 5\' 3"  (1.6 m)   Wt 285 lb (129.3 kg)   LMP 08/11/2019 (Approximate) Comment: tubaligation   SpO2 96%   BMI 50.49 kg/m   Physical Exam  General appearance - alert, well appearing, obese middle-age African-American female and in no distress Mental status - normal mood, behavior, speech, dress, motor activity, and thought processes Chest - clear to auscultation, no wheezes, rales or rhonchi, symmetric air entry Heart - normal rate, regular rhythm, normal S1, S2, no murmurs, rubs, clicks or gallops Extremities - peripheral pulses normal, no pedal edema, no clubbing  or cyanosis      Latest Ref Rng & Units 10/15/2022    1:34 PM 09/30/2022   12:17 PM 07/25/2022    9:43 AM  CMP  Glucose 70 - 99 mg/dL   409   BUN 6 - 24 mg/dL   9   Creatinine 8.11 - 1.00 mg/dL   9.14   Sodium 782 - 956 mmol/L   144   Potassium 3.5 - 5.2 mmol/L   4.3   Chloride 96 - 106 mmol/L   107   CO2 20 - 29 mmol/L 20   20   Calcium 8.7 - 10.2 mg/dL   9.2   Total Protein 6.0 - 8.5 g/dL  6.8    Total Bilirubin 0.0 - 1.2 mg/dL  0.3    Alkaline Phos 44 - 121 IU/L  95    AST 0 - 40 IU/L  17    ALT 0 - 32 IU/L  15     Lipid Panel     Component Value Date/Time   CHOL 176 09/30/2022 1217   TRIG 197 (H) 09/30/2022 1217   HDL 28 (L) 09/30/2022 1217   CHOLHDL 6.3 (H) 09/30/2022 1217   CHOLHDL 5.3 Ratio 07/31/2006 2043   VLDL 18 07/31/2006 2043   LDLCALC 113 (H) 09/30/2022 1217    CBC    Component Value Date/Time   WBC 8.5 10/15/2022 1334   WBC 8.0 05/29/2019 0809   RBC 5.79 (H) 10/15/2022 1334   RBC 5.39 (H) 05/29/2019 0809   HGB 14.3 10/15/2022 1334   HCT 44.2 10/15/2022 1334   PLT 208 10/15/2022 1334   MCV 76 (L) 10/15/2022 1334   MCH 24.7 (L) 10/15/2022 1334   MCH 26.2 05/29/2019 0809   MCHC 32.4 10/15/2022 1334   MCHC 32.4 05/29/2019 0809   RDW 15.2 10/15/2022  1334   LYMPHSABS 2.8 10/15/2022 1334   MONOABS 1.2 (H) 03/06/2014 2131   EOSABS 0.1 10/15/2022 1334   BASOSABS 0.0 10/15/2022 1334    ASSESSMENT AND PLAN: 1. Essential hypertension (Primary) Not at goal; she has been taking 40 mg of Diovan daily from a bottle that she had left from this prior dose until she got in to see me today for refill on the 80 mg tablet.  Last prescription was sent for a 90-day supply with additional refill so she really should not have ran out before this visit..  Last blood pressure reading at home was also not at goal. -I have resubmitted refill on Diovan 80 mg daily.  I recommend that we add another blood pressure medication to get her to goal of 130/80 or lower.  She is agreeable to me adding amlodipine 5 mg daily. -Nurse only visit in 2 weeks for recheck of blood pressure. - valsartan (DIOVAN) 80 MG tablet; Take 1 tablet (80 mg total) by mouth daily.  Dispense: 90 tablet; Refill: 1 - amLODipine (NORVASC) 5 MG tablet; Take 1 tablet (5 mg total) by mouth daily.  Dispense: 90 tablet; Refill: 1 - CBC - Comprehensive metabolic panel - Recheck vitals  2. Mixed hyperlipidemia Reportedly taking and tolerating pravastatin.  Continue with the medication.  Recheck lipid profile today. - Lipid panel  3. Tobacco dependence Commended her on cutting back.  Strongly encouraged her to quit.  4. Morbid (severe) obesity due to excess calories (HCC) Commended her on trying to make changes in her eating habits. Patient advised to eliminate sugary drinks from the diet, cut back on portion sizes especially of white  carbohydrates, eat more white lean meat like chicken [place] and seafood instead of beef or pork and incorporate fresh fruits and vegetables into the diet daily. Patient declines referral to a nutritionist.  5. Other insomnia Good sleep hygiene discussed and encouraged. Patient advised not to drink any caffeinated beverages or excessive alcohol use within several  hours of bedtime.  Advised to get in bed around about the same time every night.  Once in bed, turn off all lights and sounds.  If unable to fall asleep within 30 to 45 minutes of getting in bed, patient should get up and try to do something until she feels sleepy again.  At that time try getting back in bed. -Recommend trial of melatonin 3 mg nightly.  This can be purchased over-the-counter.   6. Screening for colon cancer Encourage patient to turn in the stool test that she has at home.  7. Pap smear for cervical cancer screening -will resubmit referral to GYN - Ambulatory referral to Gynecology  8. Encounter for screening mammogram for malignant neoplasm of breast MMG ordered  9. Need for vaccination against Streptococcus pneumoniae - Pneumococcal conjugate vaccine 20-valent     Patient was given the opportunity to ask questions.  Patient verbalized understanding of the plan and was able to repeat key elements of the plan.   This documentation was completed using Paediatric nurse.  Any transcriptional errors are unintentional.  No orders of the defined types were placed in this encounter.    Requested Prescriptions    No prescriptions requested or ordered in this encounter    No follow-ups on file.  Jonah Blue, MD, FACP

## 2023-07-21 NOTE — Patient Instructions (Signed)
 Your blood pressure is not at goal.  Continue valsartan 80 mg daily.  We have added another blood pressure medication called amlodipine 5 mg daily.  We will have you follow-up with our nurse in about 2 weeks for repeat blood pressure check.  Purchase melatonin 3 mg over-the-counter and take it at bedtime to help with sleep.  Continue to work on trying to quit smoking.  Healthy Eating, Adult Healthy eating may help you get and keep a healthy body weight, reduce the risk of chronic disease, and live a long and productive life. It is important to follow a healthy eating pattern. Your nutritional and calorie needs should be met mainly by different nutrient-rich foods. What are tips for following this plan? Reading food labels Read labels and choose the following: Reduced or low sodium products. Juices with 100% fruit juice. Foods with low saturated fats (<3 g per serving) and high polyunsaturated and monounsaturated fats. Foods with whole grains, such as whole wheat, cracked wheat, brown rice, and wild rice. Whole grains that are fortified with folic acid. This is recommended for females who are pregnant or who want to become pregnant. Read labels and do not eat or drink the following: Foods or drinks with added sugars. These include foods that contain brown sugar, corn sweetener, corn syrup, dextrose, fructose, glucose, high-fructose corn syrup, honey, invert sugar, lactose, malt syrup, maltose, molasses, raw sugar, sucrose, trehalose, or turbinado sugar. Limit your intake of added sugars to less than 10% of your total daily calories. Do not eat more than the following amounts of added sugar per day: 6 teaspoons (25 g) for females. 9 teaspoons (38 g) for males. Foods that contain processed or refined starches and grains. Refined grain products, such as white flour, degermed cornmeal, white bread, and white rice. Shopping Choose nutrient-rich snacks, such as vegetables, whole fruits, and nuts.  Avoid high-calorie and high-sugar snacks, such as potato chips, fruit snacks, and candy. Use oil-based dressings and spreads on foods instead of solid fats such as butter, margarine, sour cream, or cream cheese. Limit pre-made sauces, mixes, and "instant" products such as flavored rice, instant noodles, and ready-made pasta. Try more plant-protein sources, such as tofu, tempeh, black beans, edamame, lentils, nuts, and seeds. Explore eating plans such as the Mediterranean diet or vegetarian diet. Try heart-healthy dips made with beans and healthy fats like hummus and guacamole. Vegetables go great with these. Cooking Use oil to saut or stir-fry foods instead of solid fats such as butter, margarine, or lard. Try baking, boiling, grilling, or broiling instead of frying. Remove the fatty part of meats before cooking. Steam vegetables in water or broth. Meal planning  At meals, imagine dividing your plate into fourths: One-half of your plate is fruits and vegetables. One-fourth of your plate is whole grains. One-fourth of your plate is protein, especially lean meats, poultry, eggs, tofu, beans, or nuts. Include low-fat dairy as part of your daily diet. Lifestyle Choose healthy options in all settings, including home, work, school, restaurants, or stores. Prepare your food safely: Wash your hands after handling raw meats. Where you prepare food, keep surfaces clean by regularly washing with hot, soapy water. Keep raw meats separate from ready-to-eat foods, such as fruits and vegetables. Cook seafood, meat, poultry, and eggs to the recommended temperature. Get a food thermometer. Store foods at safe temperatures. In general: Keep cold foods at 38F (4.4C) or below. Keep hot foods at 138F (60C) or above. Keep your freezer at [place] Day (-17.8C) or below.  Foods are not safe to eat if they have been between the temperatures of 40-140F (4.4-60C) for more than 2 hours. What foods should I  eat? Fruits Aim to eat 1-2 cups of fresh, canned (in natural juice), or frozen fruits each day. One cup of fruit equals 1 small apple, 1 large banana, 8 large strawberries, 1 cup (237 g) canned fruit,  cup (82 g) dried fruit, or 1 cup (240 mL) 100% juice. Vegetables Aim to eat 2-4 cups of fresh and frozen vegetables each day, including different varieties and colors. One cup of vegetables equals 1 cup (91 g) broccoli or cauliflower florets, 2 medium carrots, 2 cups (150 g) raw, leafy greens, 1 large tomato, 1 large bell pepper, 1 large sweet potato, or 1 medium white potato. Grains Aim to eat 5-10 ounce-equivalents of whole grains each day. Examples of 1 ounce-equivalent of grains include 1 slice of bread, 1 cup (40 g) ready-to-eat cereal, 3 cups (24 g) popcorn, or  cup (93 g) cooked rice. Meats and other proteins Try to eat 5-7 ounce-equivalents of protein each day. Examples of 1 ounce-equivalent of protein include 1 egg,  oz nuts (12 almonds, 24 pistachios, or 7 walnut halves), 1/4 cup (90 g) cooked beans, 6 tablespoons (90 g) hummus or 1 tablespoon (16 g) peanut butter. A cut of meat or fish that is the size of a deck of cards is about 3-4 ounce-equivalents (85 g). Of the protein you eat each week, try to have at least 8 sounce (227 g) of seafood. This is about 2 servings per week. This includes salmon, trout, herring, sardines, and anchovies. Dairy Aim to eat 3 cup-equivalents of fat-free or low-fat dairy each day. Examples of 1 cup-equivalent of dairy include 1 cup (240 mL) milk, 8 ounces (250 g) yogurt, 1 ounces (44 g) natural cheese, or 1 cup (240 mL) fortified soy milk. Fats and oils Aim for about 5 teaspoons (21 g) of fats and oils per day. Choose monounsaturated fats, such as canola and olive oils, mayonnaise made with olive oil or avocado oil, avocados, peanut butter, and most nuts, or polyunsaturated fats, such as sunflower, corn, and soybean oils, walnuts, pine nuts, sesame seeds,  sunflower seeds, and flaxseed. Beverages Aim for 6 eight-ounce glasses of water per day. Limit coffee to 3-5 eight-ounce cups per day. Limit caffeinated beverages that have added calories, such as soda and energy drinks. If you drink alcohol: Limit how much you have to: 0-1 drink a day if you are female. 0-2 drinks a day if you are female. Know how much alcohol is in your drink. In the [place] one drink is one 12 oz bottle of beer (355 mL), one 5 oz glass of wine (148 mL), or one 1 oz glass of hard liquor (44 mL). Seasoning and other foods Try not to add too much salt to your food. Try using herbs and spices instead of salt. Try not to add sugar to food. This information is based on [place] nutrition guidelines. To learn more, visit DisposableNylon.be. Exact amounts may vary. You may need different amounts. This information is not intended to replace advice given to you by your health care provider. Make sure you discuss any questions you have with your health care provider. Document Revised: 02/10/2022 Document Reviewed: 02/10/2022 Elsevier Patient Education  2024 [employer].

## 2023-07-22 ENCOUNTER — Encounter: Payer: Self-pay | Admitting: Internal Medicine

## 2023-07-22 ENCOUNTER — Other Ambulatory Visit: Payer: Self-pay | Admitting: Internal Medicine

## 2023-07-22 DIAGNOSIS — E782 Mixed hyperlipidemia: Secondary | ICD-10-CM

## 2023-07-22 LAB — LIPID PANEL
Chol/HDL Ratio: 5.8 ratio — ABNORMAL HIGH (ref 0.0–4.4)
Cholesterol, Total: 197 mg/dL (ref 100–199)
HDL: 34 mg/dL — ABNORMAL LOW (ref >39)
LDL Chol Calc (NIH): 141 mg/dL — ABNORMAL HIGH (ref 0–99)
Triglycerides: 118 mg/dL (ref 0–149)
VLDL Cholesterol Cal: 22 mg/dL (ref 5–40)

## 2023-07-22 LAB — CBC
Hematocrit: 44.7 % (ref 34.0–46.6)
Hemoglobin: 14.7 g/dL (ref 11.1–15.9)
MCH: 25.2 pg — ABNORMAL LOW (ref 26.6–33.0)
MCHC: 32.9 g/dL (ref 31.5–35.7)
MCV: 77 fL — ABNORMAL LOW (ref 79–97)
Platelets: 217 10*3/uL (ref 150–450)
RBC: 5.83 x10E6/uL — ABNORMAL HIGH (ref 3.77–5.28)
RDW: 14.6 % (ref 11.7–15.4)
WBC: 11.4 10*3/uL — ABNORMAL HIGH (ref 3.4–10.8)

## 2023-07-22 LAB — COMPREHENSIVE METABOLIC PANEL
ALT: 14 IU/L (ref 0–32)
AST: 15 IU/L (ref 0–40)
Albumin: 4.1 g/dL (ref 3.8–4.9)
Alkaline Phosphatase: 91 IU/L (ref 44–121)
BUN/Creatinine Ratio: 12 (ref 9–23)
BUN: 10 mg/dL (ref 6–24)
Bilirubin Total: 0.3 mg/dL (ref 0.0–1.2)
CO2: 25 mmol/L (ref 20–29)
Calcium: 9.3 mg/dL (ref 8.7–10.2)
Chloride: 104 mmol/L (ref 96–106)
Creatinine, Ser: 0.82 mg/dL (ref 0.57–1.00)
Globulin, Total: 2.8 g/dL (ref 1.5–4.5)
Glucose: 102 mg/dL — ABNORMAL HIGH (ref 70–99)
Potassium: 3.8 mmol/L (ref 3.5–5.2)
Sodium: 143 mmol/L (ref 134–144)
Total Protein: 6.9 g/dL (ref 6.0–8.5)
eGFR: 84 mL/min/{1.73_m2} (ref >59)

## 2023-07-22 MED ORDER — PRAVASTATIN SODIUM 20 MG PO TABS: 20.0000 mg | ORAL_TABLET | Freq: Every day | ORAL | 2 refills | Status: DC

## 2023-07-31 ENCOUNTER — Telehealth: Payer: Self-pay | Admitting: Internal Medicine

## 2023-07-31 NOTE — Telephone Encounter (Signed)
 Contacted pt confirm appt!

## 2023-08-04 ENCOUNTER — Ambulatory Visit: Payer: No Typology Code available for payment source | Attending: Internal Medicine

## 2023-08-04 VITALS — BP 140/78 | HR 89

## 2023-08-04 DIAGNOSIS — I10 Essential (primary) hypertension: Secondary | ICD-10-CM | POA: Diagnosis not present

## 2023-08-04 MED ORDER — AMLODIPINE BESYLATE 10 MG PO TABS: 10.0000 mg | ORAL_TABLET | Freq: Every day | ORAL | 0 refills | Status: DC

## 2023-08-04 NOTE — Progress Notes (Signed)
   Blood Pressure Recheck Visit  Name: Kaitlyn Robertson MRN: [id number] Date of Birth: 01/13/1966  [name] presents today for Blood Pressure recheck with clinical support staff.  Order for BP recheck by Dr. Timoteo Expose, ordered on 08/04/2023.   BP Readings from Last 3 Encounters:  08/04/23 (!) 140/78  07/21/23 (!) 198/81  03/17/23 (!) 146/98    Current Outpatient Medications  Medication Sig Dispense Refill   amLODipine (NORVASC) 10 MG tablet Take 1 tablet (10 mg total) by mouth daily. 90 tablet 0   Blood Pressure Monitor DEVI Please provide patient with insurance approved blood pressure monitor. I10.0 1 each 0   pravastatin (PRAVACHOL) 20 MG tablet Take 1 tablet (20 mg total) by mouth daily. STOP Atorvastatin 90 tablet 2   valsartan (DIOVAN) 80 MG tablet Take 1 tablet (80 mg total) by mouth daily. 90 tablet 1   No current facility-administered medications for this visit.    Hypertensive Medication Review: Patient states that they are taking all their hypertensive medications as prescribed and their last dose of hypertensive medications was this morning   Documentation of any medication adherence discrepancies: none  Provider Recommendation:  Spoke to Dr. Laural Benes and they stated: increase Amlodipine to 10 mg daily.   Patient has been scheduled to follow up with Dr. Timoteo Expose on 11/19/2023  Patient has been given provider's recommendations and does not have any questions or concerns at this time. Patient will contact the office for any future questions or concerns.

## 2023-08-07 ENCOUNTER — Other Ambulatory Visit: Payer: Self-pay | Admitting: Internal Medicine

## 2023-08-07 DIAGNOSIS — I10 Essential (primary) hypertension: Secondary | ICD-10-CM

## 2023-08-07 NOTE — Telephone Encounter (Signed)
 Duplicate request, last refill 08/04/23.  Requested Prescriptions  Pending Prescriptions Disp Refills   amLODipine (NORVASC) 10 MG tablet [Pharmacy Med Name: AMLODIPINE BESYLATE 10MG  TABLETS] 90 tablet 0    Sig: TAKE 1 TABLET(10 MG) BY MOUTH DAILY     Cardiovascular: Calcium Channel Blockers 2 Failed - 08/07/2023  3:40 PM      Failed - Last BP in normal range    BP Readings from Last 1 Encounters:  08/04/23 (!) 140/78         Passed - Last Heart Rate in normal range    Pulse Readings from Last 1 Encounters:  08/04/23 89         Passed - Valid encounter within last 6 months    Recent Outpatient Visits           2 weeks ago Encounter for screening mammogram for malignant neoplasm of breast   [facility] [place] Marcine Matar, MD   4 months ago Essential hypertension   [facility] [place] Marcine Matar, MD   9 months ago URI with cough and congestion   [facility] [place]. [facility] Claiborne Rigg, NP   10 months ago Essential hypertension   [facility] [place] Marcine Matar, MD   11 months ago Primary hypertension   [facility] - [facility] Dept Of . [facility] Drucilla Chalet, RPH-CPP

## 2023-08-13 ENCOUNTER — Ambulatory Visit
Admission: RE | Admit: 2023-08-13 | Discharge: 2023-08-13 | Disposition: A | Discharge status: Home / Self Care | Payer: No Typology Code available for payment source | Source: Ambulatory Visit | Attending: Internal Medicine | Admitting: Internal Medicine

## 2023-08-13 DIAGNOSIS — Z1231 Encounter for screening mammogram for malignant neoplasm of breast: Secondary | ICD-10-CM | POA: Diagnosis not present

## 2023-08-16 ENCOUNTER — Encounter: Payer: Self-pay | Admitting: Internal Medicine

## 2023-09-11 ENCOUNTER — Other Ambulatory Visit: Payer: Self-pay | Admitting: Internal Medicine

## 2023-09-11 DIAGNOSIS — E782 Mixed hyperlipidemia: Secondary | ICD-10-CM

## 2023-09-25 ENCOUNTER — Ambulatory Visit: Payer: Self-pay

## 2023-09-25 NOTE — Telephone Encounter (Signed)
 Chief Complaint: back pain and spasms Symptoms: see above Frequency: few weeks Pertinent Negatives: Patient denies fever, numbness, tingling, pain in the legs, urinary symptoms Disposition: [] ED /[] Urgent Care (no appt availability in office) / [x] Appointment(In office/virtual)/ []  [facility]/ [] Home Care/ [] Refused Recommended Disposition /[] [facility]/ []  Follow-up with PCP Additional Notes: Pt reports 5/10 back pain with spasms for a few weeks. Pain is to the lower  back with intermittent spasms. Pt states that Tylenol  does not work for her. Pt has tried lidocaine  patches, they did not work either. Pt states she has a hx of back pain and sciatica. RN advised pt she should be seen by a HCP. No availability in the office for 2 wks. RN advised RN would relay to the office pt's symptoms and the need for an appt. Pt verbalized understanding. RN gave pt home care advice. Pt is not sure if she can take NSAIDS right now with her other medications. RN advised RN would relay that question to her provider for follow-up. RN advised pt if she has severe pain, numbness, incontinence, or becomes unable to walk she should go to the ED. Pt verbalized understanding.    Copied from CRM [id number]. Topic: Clinical - Red Word Triage >> Sep 25, 2023  2:19 PM Oddis Bench wrote: Red Word that prompted transfer to Nurse Triage: Patient is stating that she is having really bad back spasms, she has issue with siatica and wants to know what she can take over the counter or have something prescribed. Reason for Disposition  Back pain present > 2 weeks  Answer Assessment - Initial Assessment Questions 1. ONSET: "When did the pain begin?"      Few weeks  2. LOCATION: "Where does it hurt?" (upper, mid or lower back)     Lower back, top of her butt 3. SEVERITY: "How bad is the pain?"  (e.g., Scale 1-10; mild, moderate, or severe)   - MILD (1-3): Doesn't interfere with normal activities.    - MODERATE  (4-7): Interferes with normal activities or awakens from sleep.    - SEVERE (8-10): Excruciating pain, unable to do any normal activities.      5-6/10 4. PATTERN: "Is the pain constant?" (e.g., yes, no; constant, intermittent)      Intermittent  5. RADIATION: "Does the pain shoot into your legs or somewhere else?"     Not yet 6. CAUSE:  "What do you think is causing the back pain?"      Hx of sciatica  7. BACK OVERUSE:  "Any recent lifting of heavy objects, strenuous work or exercise?"     No 8. MEDICINES: "What have you taken so far for the pain?" (e.g., nothing, acetaminophen , NSAIDS)     Nothing, states Tylenol  does not work  9. NEUROLOGIC SYMPTOMS: "Do you have any weakness, numbness, or problems with bowel/bladder control?"     Denies weakness. Denies numbness. No trouble with bowel or bladder 10. OTHER SYMPTOMS: "Do you have any other symptoms?" (e.g., fever, abdomen pain, burning with urination, blood in urine)       Pt able to walk, albeit slowly. Denies urinary symptoms.  Protocols used: Back Pain-A-AH

## 2023-10-21 ENCOUNTER — Ambulatory Visit: Admitting: Nurse Practitioner

## 2023-10-22 ENCOUNTER — Other Ambulatory Visit: Payer: Self-pay | Admitting: Internal Medicine

## 2023-10-22 DIAGNOSIS — I10 Essential (primary) hypertension: Secondary | ICD-10-CM

## 2023-11-19 ENCOUNTER — Ambulatory Visit: Payer: No Typology Code available for payment source | Attending: Internal Medicine | Admitting: Internal Medicine

## 2023-11-19 ENCOUNTER — Encounter: Payer: Self-pay | Admitting: Internal Medicine

## 2023-11-19 VITALS — BP 147/91 | HR 82 | Temp 98.2°F | Ht 63.0 in | Wt 284.0 lb

## 2023-11-19 DIAGNOSIS — E782 Mixed hyperlipidemia: Secondary | ICD-10-CM

## 2023-11-19 DIAGNOSIS — R4586 Emotional lability: Secondary | ICD-10-CM

## 2023-11-19 DIAGNOSIS — F320 Major depressive disorder, single episode, mild: Secondary | ICD-10-CM | POA: Diagnosis not present

## 2023-11-19 DIAGNOSIS — I10 Essential (primary) hypertension: Secondary | ICD-10-CM

## 2023-11-19 DIAGNOSIS — F17210 Nicotine dependence, cigarettes, uncomplicated: Secondary | ICD-10-CM | POA: Diagnosis not present

## 2023-11-19 DIAGNOSIS — F17200 Nicotine dependence, unspecified, uncomplicated: Secondary | ICD-10-CM

## 2023-11-19 DIAGNOSIS — Z1211 Encounter for screening for malignant neoplasm of colon: Secondary | ICD-10-CM

## 2023-11-19 MED ORDER — AMLODIPINE BESYLATE 5 MG PO TABS: 5.0000 mg | ORAL_TABLET | Freq: Every day | ORAL | 1 refills | Status: DC

## 2023-11-19 NOTE — Progress Notes (Signed)
 Patient ID: Kaitlyn Robertson, female    DOB: March 31, 1966  MRN: [id number]  CC: Hypertension (HTN f/u. [facility] dosage decrease for amlodipine  & pravastatin /Reports getting Hep B vax at work but job site is no longer operating [employer] results)   Subjective: Kaitlyn Robertson is a 58 y.o. female who presents for chronic ds management. Her concerns today include:  Patient with history of HTN, obesity, tob dep, anxiety/dep, GERD, OA hips, sciatica, Vitilago   Discussed the use of AI scribe software for clinical note transcription with the patient, who gave verbal consent to proceed.  History of Present Illness Kaitlyn Robertson is a 58 year old female with hypertension and hyperlipidemia who presents for follow-up of her chronic medical conditions.  On last visit, we left her on valsartan  80 mg daily and added amlodipine  5 mg daily.  Subsequently had follow-up visit with for repeat blood pressure check with RN.  Blood pressure was still elevated so I recommended increasing the amlodipine  to 10 mg daily.  She never increased to 10 stating that she did not want to be on higher dose.  She takes valsartan  80 mg and amlodipine  5 mg daily consistently, with home blood pressure readings between 127/69 and 132/70 at home.  Also reports blood pressure at dental visit last week was 128/78.  She experiences higher readings during medical visits due to stress of trying to find parking. There is no chest pain or shortness of breath, and exertional dyspnea has improved since starting amlodipine . No current leg swelling is noted.  HL: Blood test that was done on last visit revealed increase in LDL from 113 to 141.  I sent her her lab results and recommended increasing the pravastatin  from 10 mg daily to 20 mg daily.  She picked up the 20 mg but never took it.  She continued to take the 10 mg.she admits that she was not taking the 10 mg consistently every day at the time of our last visit but subsequently started  doing so after she saw her results.  She would like to have her levels rechecked today.   She smokes, with a pack lasting a week, and has not decided to quit.  She screened positive for depression today which she attributes to being out of work on disability due to issues with her back knee and ankle. She experiences mood swings and agitation, associating them with premenopausal symptoms, and declines medication for these symptoms.    Patient Active Problem List   Diagnosis Date Noted   Morbid obesity with BMI of 45.0-49.9, adult (HCC) 04/29/2021   Mixed hyperlipidemia 04/29/2021   Vitiligo 04/29/2021   Chronic pain of both knees 04/29/2021   Chronic radicular pain of lower back 04/29/2021   Atypical chest pain 01/30/2012   Anxiety 01/30/2012   Tobacco dependence 02/03/2009   OBESITY NOS 07/31/2006   Essential hypertension 07/31/2006     Current Outpatient Medications on File Prior to Visit  Medication Sig Dispense Refill   Blood Pressure Monitor DEVI Please provide patient with insurance approved blood pressure monitor. I10.0 1 each 0   valsartan  (DIOVAN ) 80 MG tablet Take 1 tablet (80 mg total) by mouth daily. 90 tablet 1   pravastatin  (PRAVACHOL ) 20 MG tablet Take 1 tablet (20 mg total) by mouth daily. STOP Atorvastatin  (Patient not taking: Reported on 11/19/2023) 90 tablet 2   No current facility-administered medications on file prior to visit.    Allergies  Allergen Reactions   Atorvastatin   Caused moodiness   Citrus Hives   Paxil [Paroxetine] Other (See Comments)    Change in vision like light flashing on and off.  Also caused tremors    Social History   Socioeconomic History   Marital status: Single    Spouse name: Not on file   Number of children: Not on file   Years of education: Not on file   Highest education level: Some college, no degree  Occupational History   Not on file  Tobacco Use   Smoking status: Some Days    Current packs/day: 0.50    Average  packs/day: 0.5 packs/day for 31.0 years (15.5 ttl pk-yrs)    Types: Cigarettes   Smokeless tobacco: Never  Vaping Use   Vaping status: Never Used  Substance and Sexual Activity   Alcohol use: Not Currently    Comment: occasionally   Drug use: No   Sexual activity: Not Currently  Other Topics Concern   Not on file  Social History Narrative   Not on file   Social Drivers of Health   Financial Resource Strain: Medium Risk (11/16/2023)   Overall Financial Resource Strain (CARDIA)    Difficulty of Paying Living Expenses: Somewhat hard  Food Insecurity: Food Insecurity Present (11/16/2023)   Hunger Vital Sign    Worried About Running Out of Food in the Last Year: Sometimes true    Ran Out of Food in the Last Year: Sometimes true  Transportation Needs: No Transportation Needs (11/16/2023)   PRAPARE - Administrator, Civil Service (Medical): No    Lack of Transportation (Non-Medical): No  Physical Activity: Insufficiently Active (11/16/2023)   Exercise Vital Sign    Days of Exercise per Week: 2 days    Minutes of Exercise per Session: 30 min  Stress: No Stress Concern Present (11/16/2023)   [employer] of Occupational Health - Occupational Stress Questionnaire    Feeling of Stress: Only a little  Social Connections: Moderately Isolated (11/16/2023)   Social Connection and Isolation Panel    Frequency of Communication with Friends and Family: More than three times a week    Frequency of Social Gatherings with Friends and Family: Once a week    Attends Religious Services: 1 to 4 times per year    Active Member of [employer] or Organizations: No    Attends Engineer, structural: Not on file    Marital Status: Never married  Intimate Partner Violence: Not At Risk (07/21/2023)   Humiliation, Afraid, Rape, and Kick questionnaire    Fear of Current or Ex-Partner: No    Emotionally Abused: No    Physically Abused: No    Sexually Abused: No    Family History  Problem  Relation Age of Onset   Hypertension Mother    Hypertension Father    Breast cancer Maternal Aunt     Past Surgical History:  Procedure Laterality Date   CESAREAN SECTION      ROS: Review of Systems Negative except as stated above  PHYSICAL EXAM: BP (!) 147/91   Pulse 82   Temp 98.2 F (36.8 C) (Oral)   Ht 5' 3 (1.6 m)   Wt 284 lb (128.8 kg)   LMP 08/11/2019 (Approximate) Comment: tubaligation   SpO2 96%   BMI 50.31 kg/m   Physical Exam  General appearance - alert, well appearing, obese older AAF and in no distress Mental status - normal mood, behavior, speech, dress, motor activity, and thought processes Neck - supple, no  significant adenopathy Chest - clear to auscultation, no wheezes, rales or rhonchi, symmetric air entry Heart - normal rate, regular rhythm, normal S1, S2, no murmurs, rubs, clicks or gallops Extremities - peripheral pulses normal, no pedal edema, no clubbing or cyanosis Skin: hypopigmentation of face, hands unchaged     11/19/2023    9:24 AM 07/21/2023   10:16 AM 09/30/2022   11:12 AM  Depression screen PHQ 2/9  Decreased Interest 2 2 1   Down, Depressed, Hopeless 2 2 2   PHQ - 2 Score 4 4 3   Altered sleeping 1 1 3   Tired, decreased energy 3 2 2   Change in appetite 0 0 0  Feeling bad or failure about yourself  2 2 0  Trouble concentrating 0 0 2  Moving slowly or fidgety/restless 0 0 0  Suicidal thoughts 0 0 0  PHQ-9 Score 10 9 10   Difficult doing work/chores Somewhat difficult Somewhat difficult         Latest Ref Rng & Units 07/21/2023   11:23 AM 10/15/2022    1:34 PM 09/30/2022   12:17 PM  CMP  Glucose 70 - 99 mg/dL 897     BUN 6 - 24 mg/dL 10     Creatinine 9.42 - 1.00 mg/dL 9.17     Sodium 865 - 855 mmol/L 143     Potassium 3.5 - 5.2 mmol/L 3.8     Chloride 96 - 106 mmol/L 104     CO2 20 - 29 mmol/L 25  20    Calcium  8.7 - 10.2 mg/dL 9.3     Total Protein 6.0 - 8.5 g/dL 6.9   6.8   Total Bilirubin 0.0 - 1.2 mg/dL 0.3   0.3    Alkaline Phos 44 - 121 IU/L 91   95   AST 0 - 40 IU/L 15   17   ALT 0 - 32 IU/L 14   15    Lipid Panel     Component Value Date/Time   CHOL 197 07/21/2023 1123   TRIG 118 07/21/2023 1123   HDL 34 (L) 07/21/2023 1123   CHOLHDL 5.8 (H) 07/21/2023 1123   CHOLHDL 5.3 Ratio 07/31/2006 2043   VLDL 18 07/31/2006 2043   LDLCALC 141 (H) 07/21/2023 1123    CBC    Component Value Date/Time   WBC 11.4 (H) 07/21/2023 1123   WBC 8.0 05/29/2019 0809   RBC 5.83 (H) 07/21/2023 1123   RBC 5.39 (H) 05/29/2019 0809   HGB 14.7 07/21/2023 1123   HCT 44.7 07/21/2023 1123   PLT 217 07/21/2023 1123   MCV 77 (L) 07/21/2023 1123   MCH 25.2 (L) 07/21/2023 1123   MCH 26.2 05/29/2019 0809   MCHC 32.9 07/21/2023 1123   MCHC 32.4 05/29/2019 0809   RDW 14.6 07/21/2023 1123   LYMPHSABS 2.8 10/15/2022 1334   MONOABS 1.2 (H) 03/06/2014 2131   EOSABS 0.1 10/15/2022 1334   BASOSABS 0.0 10/15/2022 1334    ASSESSMENT AND PLAN: 1. Essential hypertension (Primary) Repeat blood pressure today has improved but not at goal.  She is reporting readings at home in target range and good blood pressure reading last week at the dentist.  She is not interested in increasing amlodipine .  She will continue the amlodipine  5 mg daily and Diovan  80 mg daily. - amLODipine  (NORVASC ) 5 MG tablet; Take 1 tablet (5 mg total) by mouth daily.  Dispense: 90 tablet; Refill: 1  2. Mixed hyperlipidemia Last LDL was not at goal.  Since  then she has been taking the pravastatin  10 mg daily consistently. - Lipid panel  3. Tobacco dependence Commended her on cutting back but strongly encouraged to quit.  Let us  know when she is ready to give a trial of quitting.  4. Major depressive disorder, single episode, mild (HCC) Patient does not feel that she needs to be on any medications or needs any counseling at this time.  5. Mood swings Which she associates with menopause Declined trail of SSRIs  6. Screening for colon cancer Will  call [facility] to determine if sample was received in February and if so send us  results. Message sent to my CMA  Orders Placed This Encounter  Procedures   Lipid panel     Requested Prescriptions   Signed Prescriptions Disp Refills   amLODipine  (NORVASC ) 5 MG tablet 90 tablet 1    Sig: Take 1 tablet (5 mg total) by mouth daily.    Return in about 4 months (around 03/20/2024) for chronic ds management.  Barnie Louder, MD, FACP

## 2023-11-19 NOTE — Patient Instructions (Addendum)
 [place] [facility] [employer] [facility] [address]  [place], [place] [address] [place]  VISIT SUMMARY:  Today, you came in for a follow-up visit to manage your chronic conditions, including hypertension and hyperlipidemia. We also discussed your smoking habits and depression symptoms.  YOUR PLAN:  -HYPERTENSION: Hypertension means high blood pressure. Your home readings are well-controlled, but your office readings are higher due to stress. Continue taking valsartan  80 mg and amlodipine  5 mg daily. Keep monitoring your blood pressure at home and follow a low-salt diet.  -HYPERLIPIDEMIA: Hyperlipidemia means high levels of fats (like cholesterol) in your blood. Your LDL cholesterol increased when you took pravastatin  every other day. Continue taking pravastatin  10 mg daily, and we will recheck your cholesterol levels soon. Depending on the results, we may adjust your dose.  -TOBACCO USE: You are currently smoking one pack of cigarettes per week and are not ready to quit yet. Continue to reduce your smoking, and we are here to support you when you decide to quit.  -DEPRESSION: You screened positive for depression but prefer not to take medication or attend counseling at this time. We respect your decision and are here to support you in any way we can.  -GENERAL HEALTH MAINTENANCE: You have a pending Pap smear appointment and are waiting for Cologuard test results. Please contact the [facility] to schedule your Pap smear and follow up with [facility] for your Cologuard results.  INSTRUCTIONS:  Please continue monitoring your blood pressure at home and follow a low-salt diet. Keep taking your medications as prescribed. Schedule your Pap smear appointment with the [facility] and follow up on your Cologuard test results. We will recheck your cholesterol levels soon to determine if any adjustments are needed. If you have any  questions or concerns, feel free to contact our office.

## 2023-11-20 ENCOUNTER — Ambulatory Visit: Payer: Self-pay | Admitting: Internal Medicine

## 2023-11-20 LAB — LIPID PANEL
Chol/HDL Ratio: 6.7 ratio — ABNORMAL HIGH (ref 0.0–4.4)
Cholesterol, Total: 213 mg/dL — ABNORMAL HIGH (ref 100–199)
HDL: 32 mg/dL — ABNORMAL LOW (ref >39)
LDL Chol Calc (NIH): 140 mg/dL — ABNORMAL HIGH (ref 0–99)
Triglycerides: 223 mg/dL — ABNORMAL HIGH (ref 0–149)
VLDL Cholesterol Cal: 41 mg/dL — ABNORMAL HIGH (ref 5–40)

## 2023-12-17 ENCOUNTER — Telehealth: Payer: Self-pay | Admitting: Internal Medicine

## 2023-12-17 DIAGNOSIS — Z1211 Encounter for screening for malignant neoplasm of colon: Secondary | ICD-10-CM

## 2023-12-17 NOTE — Telephone Encounter (Signed)
-----   Message from [facility] Clarisa A sent at 12/16/2023  4:41 PM EDT ----- Regarding: FW: Cologuard Called Visual merchandiser and spoke to [facility] She stated that there are no results for the pt due to never receiving it. Looks like the order has since expired and will need a new order for the patient to receive a new kit. ----- Message ----- From: Vicci Barnie NOVAK, MD Sent: 11/19/2023   9:57 AM EDT To: Brent LOISE Crocker, CMA Subject: Cologuard                                      Truned in cologuard 06/2023.  We did not receive results.  Please call [facility]

## 2024-01-18 ENCOUNTER — Encounter (HOSPITAL_COMMUNITY): Payer: Self-pay

## 2024-01-18 ENCOUNTER — Ambulatory Visit (HOSPITAL_COMMUNITY)
Admission: EM | Admit: 2024-01-18 | Discharge: 2024-01-18 | Disposition: A | Discharge status: Home / Self Care | Attending: Family Medicine | Admitting: Family Medicine

## 2024-01-18 DIAGNOSIS — R109 Unspecified abdominal pain: Secondary | ICD-10-CM

## 2024-01-18 LAB — POCT URINALYSIS DIP (MANUAL ENTRY)
Bilirubin, UA: NEGATIVE
Glucose, UA: NEGATIVE mg/dL
Ketones, POC UA: NEGATIVE mg/dL
Leukocytes, UA: NEGATIVE
Nitrite, UA: NEGATIVE
Protein Ur, POC: NEGATIVE mg/dL
Spec Grav, UA: <=1.005 — AB (ref 1.010–1.025)
Urobilinogen, UA: 0.2 U/dL
pH, UA: 6 (ref 5.0–8.0)

## 2024-01-18 NOTE — ED Provider Notes (Signed)
 [facility]    CSN: [id number] Arrival date & time: 01/18/24  1018      History   Chief Complaint Chief Complaint  Patient presents with   Abdominal Pain    HPI Kaitlyn Robertson is a 58 y.o. female.    Abdominal Pain Associated symptoms: chills, diarrhea and fever    Patient is here for abdominal pain x 2 days.  Located at the left lower abdomen.  Having some fatigue, loss of appetite, chills.  Also with diarrhea.  No blood in her stools.  She did have some fever/chills when this first started.  Never has had this pain before.  At this time her pain is 8/10.        Past Medical History:  Diagnosis Date   Anxiety    GERD (gastroesophageal reflux disease)    Hypertension    Sciatica     Patient Active Problem List   Diagnosis Date Noted   Morbid obesity with BMI of 45.0-49.9, adult (HCC) 04/29/2021   Mixed hyperlipidemia 04/29/2021   Vitiligo 04/29/2021   Chronic pain of both knees 04/29/2021   Chronic radicular pain of lower back 04/29/2021   Atypical chest pain 01/30/2012   Anxiety 01/30/2012   Tobacco dependence 02/03/2009   OBESITY NOS 07/31/2006   Essential hypertension 07/31/2006    Past Surgical History:  Procedure Laterality Date   CESAREAN SECTION      OB History   No obstetric history on file.      Home Medications    Prior to Admission medications   Medication Sig Start Date End Date Taking? Authorizing Provider  amLODipine  (NORVASC ) 5 MG tablet Take 1 tablet (5 mg total) by mouth daily. 11/19/23   Vicci Barnie NOVAK, MD  Blood Pressure Monitor DEVI Please provide patient with insurance approved blood pressure monitor. I10.0 08/28/22   Fleming, Zelda W, NP  pravastatin  (PRAVACHOL ) 20 MG tablet Take 1 tablet (20 mg total) by mouth daily. STOP Atorvastatin  Patient not taking: Reported on 11/19/2023 07/22/23   Vicci Barnie NOVAK, MD  valsartan  (DIOVAN ) 80 MG tablet Take 1 tablet (80 mg total) by mouth daily. 07/21/23   Vicci Barnie NOVAK, MD    Family History Family History  Problem Relation Age of Onset   Hypertension Mother    Hypertension Father    Breast cancer Maternal Aunt     Social History Social History   Tobacco Use   Smoking status: Some Days    Current packs/day: 0.50    Average packs/day: 0.5 packs/day for 31.0 years (15.5 ttl pk-yrs)    Types: Cigarettes   Smokeless tobacco: Never  Vaping Use   Vaping status: Never Used  Substance Use Topics   Alcohol use: Not Currently    Comment: occasionally   Drug use: No     Allergies   Atorvastatin , Citrus, and Paxil [paroxetine]   Review of Systems Review of Systems  Constitutional:  Positive for chills and fever.  HENT: Negative.    Respiratory: Negative.    Gastrointestinal:  Positive for abdominal pain and diarrhea.  Genitourinary: Negative.   Musculoskeletal: Negative.   Psychiatric/Behavioral: Negative.       Physical Exam Triage Vital Signs ED Triage Vitals  Encounter Vitals Group     BP 01/18/24 1132 99/68     Girls Systolic BP Percentile --      Girls Diastolic BP Percentile --      Boys Systolic BP Percentile --      Boys  Diastolic BP Percentile --      Pulse Rate 01/18/24 1132 (!) 115     Resp 01/18/24 1132 16     Temp 01/18/24 1132 98.9 F (37.2 C)     Temp Source 01/18/24 1120 Oral     SpO2 01/18/24 1132 95 %     Weight --      Height --      Head Circumference --      Peak Flow --      Pain Score 01/18/24 1135 6     Pain Loc --      Pain Education --      Exclude from Growth Chart --    No data found.  Updated Vital Signs BP 99/68 (BP Location: Left Arm)   Pulse (!) 115   Temp 98.9 F (37.2 C) (Oral)   Resp 16   LMP 08/11/2019 (Approximate) Comment: tubaligation   SpO2 95%   Visual Acuity Right Eye Distance:   Left Eye Distance:   Bilateral Distance:    Right Eye Near:   Left Eye Near:    Bilateral Near:     Physical Exam Constitutional:      General: She is in acute distress.      Appearance: She is well-developed and normal weight.  Cardiovascular:     Rate and Rhythm: Normal rate and regular rhythm.  Pulmonary:     Effort: Pulmonary effort is normal.     Breath sounds: Normal breath sounds.  Abdominal:     General: Bowel sounds are normal.     Palpations: Abdomen is soft.     Tenderness: There is generalized abdominal tenderness.     Comments: No guarding or rebound noted;  TTP at the RUQ, LUQ and LLQ  Skin:    General: Skin is warm.  Neurological:     General: No focal deficit present.     Mental Status: She is alert.  Psychiatric:        Mood and Affect: Mood normal.      UC Treatments / Results  Labs (all labs ordered are listed, but only abnormal results are displayed) Labs Reviewed  POCT URINALYSIS DIP (MANUAL ENTRY) - Abnormal; Notable for the following components:      Result Value   Spec Grav, UA <=1.005 (*)    Blood, UA trace-lysed (*)    All other components within normal limits    EKG   Radiology No results found.  Procedures Procedures (including critical care time)  Medications Ordered in UC Medications - No data to display  Initial Impression / Assessment and Plan / UC Course  I have reviewed the triage vital signs and the nursing notes.  Pertinent labs & imaging results that were available during my care of the patient were reviewed by me and considered in my medical decision making (see chart for details).   Final Clinical Impressions(s) / UC Diagnoses   Final diagnoses:  Abdominal pain, unspecified abdominal location     Discharge Instructions      You were seen today for abdominal pain.  Given your level of pain, you should go to the ER for further evaluation.     ED Prescriptions   None    PDMP not reviewed this encounter.   Darral Longs, MD 01/18/24 [phone]

## 2024-01-18 NOTE — ED Notes (Signed)
 Patient is being discharged from the Urgent Care and sent to the Emergency Department via private vehicle . Per provider, patient is in need of higher level of care due to left lower abd pain. Patient is aware and verbalizes understanding of plan of care.  Vitals:   01/18/24 1132  BP: 99/68  Pulse: (!) 115  Resp: 16  Temp: 98.9 F (37.2 C)  SpO2: 95%

## 2024-01-18 NOTE — ED Triage Notes (Signed)
 Patient here today with c/o fatigue, loss of appetite, chills, and left lower abd pain since Saturday. Has not taken anything for her symptoms. Patient thinks that she has a UTI.

## 2024-01-18 NOTE — Discharge Instructions (Signed)
 You were seen today for abdominal pain.  Given your level of pain, you should go to the ER for further evaluation.

## 2024-01-23 ENCOUNTER — Other Ambulatory Visit: Payer: Self-pay | Admitting: Internal Medicine

## 2024-01-23 DIAGNOSIS — E782 Mixed hyperlipidemia: Secondary | ICD-10-CM

## 2024-02-02 ENCOUNTER — Encounter: Admitting: Obstetrics and Gynecology

## 2024-03-10 ENCOUNTER — Other Ambulatory Visit: Payer: Self-pay | Admitting: Internal Medicine

## 2024-03-10 DIAGNOSIS — I10 Essential (primary) hypertension: Secondary | ICD-10-CM

## 2024-03-16 ENCOUNTER — Encounter: Payer: Self-pay | Admitting: Internal Medicine

## 2024-03-21 ENCOUNTER — Encounter: Payer: Self-pay | Admitting: Internal Medicine

## 2024-03-21 ENCOUNTER — Ambulatory Visit: Attending: Internal Medicine | Admitting: Internal Medicine

## 2024-03-21 VITALS — BP 124/80 | HR 84 | Temp 98.1°F | Ht 63.0 in | Wt 272.0 lb

## 2024-03-21 DIAGNOSIS — L80 Vitiligo: Secondary | ICD-10-CM | POA: Diagnosis not present

## 2024-03-21 DIAGNOSIS — I10 Essential (primary) hypertension: Secondary | ICD-10-CM | POA: Diagnosis not present

## 2024-03-21 DIAGNOSIS — F330 Major depressive disorder, recurrent, mild: Secondary | ICD-10-CM

## 2024-03-21 DIAGNOSIS — E782 Mixed hyperlipidemia: Secondary | ICD-10-CM | POA: Diagnosis not present

## 2024-03-21 DIAGNOSIS — F17210 Nicotine dependence, cigarettes, uncomplicated: Secondary | ICD-10-CM | POA: Diagnosis not present

## 2024-03-21 DIAGNOSIS — Z1211 Encounter for screening for malignant neoplasm of colon: Secondary | ICD-10-CM

## 2024-03-21 DIAGNOSIS — F17200 Nicotine dependence, unspecified, uncomplicated: Secondary | ICD-10-CM

## 2024-03-21 DIAGNOSIS — Z2821 Immunization not carried out because of patient refusal: Secondary | ICD-10-CM | POA: Diagnosis not present

## 2024-03-21 DIAGNOSIS — Z124 Encounter for screening for malignant neoplasm of cervix: Secondary | ICD-10-CM

## 2024-03-21 MED ORDER — AMLODIPINE BESYLATE 5 MG PO TABS: 5.0000 mg | ORAL_TABLET | Freq: Every day | ORAL | 1 refills | Status: AC

## 2024-03-21 MED ORDER — VALSARTAN 80 MG PO TABS: 80.0000 mg | ORAL_TABLET | Freq: Every day | ORAL | 3 refills | Status: AC

## 2024-03-21 NOTE — Progress Notes (Signed)
 Patient ID: Kaitlyn Robertson, female    DOB: July 31, 1965  MRN: [id number]  CC: Hypertension (HTN f/u. Thompson valsartan  /No to flu vax. Yes to gyn referral for pap.)   Subjective: Kaitlyn Robertson is a 58 y.o. female who presents for chronic ds management. Her concerns today include:  Patient with history of HTN, obesity, tob dep, anxiety/dep, GERD, OA hips, sciatica, Vitilago     Discussed the use of AI scribe software for clinical note transcription with the patient, who gave verbal consent to proceed.  History of Present Illness Kaitlyn Robertson is a 58 year old female who presents for follow-up of her chronic medical conditions.  She is managing hypertension with amlodipine  5 mg and valsartan  80 mg daily, with home blood pressure readings ranging from 120/62 to 190/76. No chest pain, shortness of breath, or leg swelling. She is mindful of her salt intake.  For hyperlipidemia, she is on pravastatin , increased to 20 mg daily. Her last lipid panel showed an LDL of 140 mg/dL, triglycerides of 776 mg/dL, and total cholesterol above 200 mg/dL. She takes the medication consistently every day.  Regarding depression, her PHQ-9 score increased to 15 from a previous score of 10. She has a history of using antidepressants, including Paxil and another medication starting with 'A', but discontinued them due to side effects such as slurred speech. She declines further medication or counseling referrals at this time.  She smokes half a pack of cigarettes every three days and is not ready to quit.  She has vitiligo with hypopigmented spots on her face, chest, legs, and feet, noting an increase in spots under her eyes. She has not seen a dermatologist recently due to insurance coverage issues.  In terms of health maintenance, she missed a gynecologist appointment for a Pap smear due to illness and has not yet completed her Cologuard test for colon cancer screening, although she still has the kit at  home.    Patient Active Problem List   Diagnosis Date Noted   Morbid obesity with BMI of 45.0-49.9, adult (HCC) 04/29/2021   Mixed hyperlipidemia 04/29/2021   Vitiligo 04/29/2021   Chronic pain of both knees 04/29/2021   Chronic radicular pain of lower back 04/29/2021   Atypical chest pain 01/30/2012   Anxiety 01/30/2012   Tobacco dependence 02/03/2009   OBESITY NOS 07/31/2006   Essential hypertension 07/31/2006     Current Outpatient Medications on File Prior to Visit  Medication Sig Dispense Refill   amLODipine  (NORVASC ) 5 MG tablet Take 1 tablet (5 mg total) by mouth daily. 90 tablet 1   pravastatin  (PRAVACHOL ) 20 MG tablet TAKE 1 TABLET(20 MG) BY MOUTH DAILY. STOP ATORVASTATIN  90 tablet 1   valsartan  (DIOVAN ) 80 MG tablet TAKE 1 TABLET(80 MG) BY MOUTH DAILY 30 tablet 0   Blood Pressure Monitor DEVI Please provide patient with insurance approved blood pressure monitor. I10.0 1 each 0   No current facility-administered medications on file prior to visit.    Allergies  Allergen Reactions   Atorvastatin      Caused moodiness   Citrus Hives   Paxil [Paroxetine] Other (See Comments)    Change in vision like light flashing on and off.  Also caused tremors    Social History   Socioeconomic History   Marital status: Single    Spouse name: Not on file   Number of children: Not on file   Years of education: Not on file   Highest education level: Some college, no  degree  Occupational History   Not on file  Tobacco Use   Smoking status: Some Days    Current packs/day: 0.50    Average packs/day: 0.5 packs/day for 31.0 years (15.5 ttl pk-yrs)    Types: Cigarettes   Smokeless tobacco: Never  Vaping Use   Vaping status: Never Used  Substance and Sexual Activity   Alcohol use: Not Currently    Comment: occasionally   Drug use: No   Sexual activity: Not Currently  Other Topics Concern   Not on file  Social History Narrative   Not on file   Social Drivers of Health    Financial Resource Strain: Medium Risk (03/19/2024)   Overall Financial Resource Strain (CARDIA)    Difficulty of Paying Living Expenses: Somewhat hard  Food Insecurity: Food Insecurity Present (03/19/2024)   Hunger Vital Sign    Worried About Running Out of Food in the Last Year: Sometimes true    Ran Out of Food in the Last Year: Sometimes true  Transportation Needs: No Transportation Needs (03/19/2024)   PRAPARE - Administrator, Civil Service (Medical): No    Lack of Transportation (Non-Medical): No  Physical Activity: Insufficiently Active (03/19/2024)   Exercise Vital Sign    Days of Exercise per Week: 3 days    Minutes of Exercise per Session: 20 min  Stress: Stress Concern Present (03/19/2024)   [employer] of Occupational Health - Occupational Stress Questionnaire    Feeling of Stress: To some extent  Social Connections: Moderately Isolated (03/19/2024)   Social Connection and Isolation Panel    Frequency of Communication with Friends and Family: More than three times a week    Frequency of Social Gatherings with Friends and Family: More than three times a week    Attends Religious Services: 1 to 4 times per year    Active Member of [employer] or Organizations: No    Attends Engineer, Structural: Not on file    Marital Status: Never married  Intimate Partner Violence: Not At Risk (07/21/2023)   Humiliation, Afraid, Rape, and Kick questionnaire    Fear of Current or Ex-Partner: No    Emotionally Abused: No    Physically Abused: No    Sexually Abused: No    Family History  Problem Relation Age of Onset   Hypertension Mother    Hypertension Father    Breast cancer Maternal Aunt     Past Surgical History:  Procedure Laterality Date   CESAREAN SECTION      ROS: Review of Systems Negative except as stated above  PHYSICAL EXAM: BP 138/81 (BP Location: Left Arm, Patient Position: Sitting, Cuff Size: Normal)   Pulse 84   Temp 98.1 F (36.7  C) (Oral)   Ht 5' 3 (1.6 m)   Wt 272 lb (123.4 kg)   LMP 08/11/2019 (Approximate) Comment: tubaligation   SpO2 95%   BMI 48.18 kg/m   Physical Exam  General appearance - alert, well appearing, obese AAF and in no distress Mental status - normal mood, behavior, speech, dress, motor activity, and thought processes Neck - supple, no significant adenopathy Chest - clear to auscultation, no wheezes, rales or rhonchi, symmetric air entry Heart - normal rate, regular rhythm, normal S1, S2, no murmurs, rubs, clicks or gallops Extremities - peripheral pulses normal, no pedal edema, no clubbing or cyanosis Skin: hypopigmented areas on skin including hands, legs, face      Latest Ref Rng & Units 07/21/2023   11:23  AM 10/15/2022    1:34 PM 09/30/2022   12:17 PM  CMP  Glucose 70 - 99 mg/dL 897     BUN 6 - 24 mg/dL 10     Creatinine 9.42 - 1.00 mg/dL 9.17     Sodium 865 - 855 mmol/L 143     Potassium 3.5 - 5.2 mmol/L 3.8     Chloride 96 - 106 mmol/L 104     CO2 20 - 29 mmol/L 25  20    Calcium  8.7 - 10.2 mg/dL 9.3     Total Protein 6.0 - 8.5 g/dL 6.9   6.8   Total Bilirubin 0.0 - 1.2 mg/dL 0.3   0.3   Alkaline Phos 44 - 121 IU/L 91   95   AST 0 - 40 IU/L 15   17   ALT 0 - 32 IU/L 14   15    Lipid Panel     Component Value Date/Time   CHOL 213 (H) 11/19/2023 1003   TRIG 223 (H) 11/19/2023 1003   HDL 32 (L) 11/19/2023 1003   CHOLHDL 6.7 (H) 11/19/2023 1003   CHOLHDL 5.3 Ratio 07/31/2006 2043   VLDL 18 07/31/2006 2043   LDLCALC 140 (H) 11/19/2023 1003    CBC    Component Value Date/Time   WBC 11.4 (H) 07/21/2023 1123   WBC 8.0 05/29/2019 0809   RBC 5.83 (H) 07/21/2023 1123   RBC 5.39 (H) 05/29/2019 0809   HGB 14.7 07/21/2023 1123   HCT 44.7 07/21/2023 1123   PLT 217 07/21/2023 1123   MCV 77 (L) 07/21/2023 1123   MCH 25.2 (L) 07/21/2023 1123   MCH 26.2 05/29/2019 0809   MCHC 32.9 07/21/2023 1123   MCHC 32.4 05/29/2019 0809   RDW 14.6 07/21/2023 1123   LYMPHSABS 2.8  10/15/2022 1334   MONOABS 1.2 (H) 03/06/2014 2131   EOSABS 0.1 10/15/2022 1334   BASOSABS 0.0 10/15/2022 1334    ASSESSMENT AND PLAN: 1. Essential hypertension (Primary) *** - amLODipine  (NORVASC ) 5 MG tablet; Take 1 tablet (5 mg total) by mouth daily.  Dispense: 90 tablet; Refill: 1 - valsartan  (DIOVAN ) 80 MG tablet; Take 1 tablet (80 mg total) by mouth daily.  Dispense: 90 tablet; Refill: 3  2. Mixed hyperlipidemia ***  3. Tobacco dependence ***  4. Major depressive disorder, recurrent episode, mild ***  5. Vitiligo *** - Ambulatory referral to Dermatology  6. Influenza vaccination declined ***  7. Screening for colon cancer ***  8. COVID-19 vaccination declined ***  Assessment and Plan Assessment & Plan Essential hypertension Blood pressure controlled with current regimen. - Continue amlodipine  5 mg daily. - Continue valsartan  80 mg daily. - Refill prescriptions at [facility] on [address].  Hyperlipidemia LDL cholesterol elevated at 140 mg/dL, triglycerides at 776 mg/dL. - Continue pravastatin  20 mg daily.  Depression PHQ-9 score increased to 15. Declined behavioral health referral and medication due to past negative experiences.  Vitiligo Increased hypopigmented spots. No dermatologist visit due to insurance issues. - Submit referral to [facility] dermatology.  Tobacco use Smoking half a pack every three days. Not ready to quit.  General Health Maintenance Missed Pap smear appointment. - Provide phone number for gynecologist to reschedule Pap smear. - Instruct to complete and return Cologuard kit.     1. Essential hypertension ***    Patient was given the opportunity to ask questions.  Patient verbalized understanding of the plan and was able to repeat key elements of the plan.   This documentation was  completed using Paediatric nurse.  Any transcriptional errors are unintentional.  No orders of the defined types were placed in  this encounter.    Requested Prescriptions    No prescriptions requested or ordered in this encounter    No follow-ups on file.  Barnie Louder, MD, FACP

## 2024-03-21 NOTE — Patient Instructions (Addendum)
 Please call the gynecologist back to schedule your pap smear: Placed in [facility]  [address]  [place], [place] [address] 336 [phone]  Please use and send in the Cologuard kit  Continue current blood pressure and cholesterol medications.

## 2024-03-23 ENCOUNTER — Encounter: Payer: Self-pay | Admitting: Internal Medicine

## 2024-07-01 ENCOUNTER — Other Ambulatory Visit: Payer: Self-pay | Admitting: Internal Medicine

## 2024-07-01 DIAGNOSIS — E782 Mixed hyperlipidemia: Secondary | ICD-10-CM

## 2024-07-22 ENCOUNTER — Ambulatory Visit: Admitting: Internal Medicine
